# Patient Record
Sex: Female | Born: 1960 | Race: White | Hispanic: No | Marital: Married | Smoking: Former smoker
Health system: Southern US, Community
[De-identification: ages and names within clinical notes are randomized; demographics above are authoritative.]

## PROBLEM LIST (undated history)

## (undated) DIAGNOSIS — Z973 Presence of spectacles and contact lenses: Secondary | ICD-10-CM

## (undated) DIAGNOSIS — F341 Dysthymic disorder: Secondary | ICD-10-CM

## (undated) DIAGNOSIS — R000 Tachycardia, unspecified: Secondary | ICD-10-CM

## (undated) DIAGNOSIS — R002 Palpitations: Secondary | ICD-10-CM

## (undated) DIAGNOSIS — E785 Hyperlipidemia, unspecified: Secondary | ICD-10-CM

## (undated) DIAGNOSIS — K219 Gastro-esophageal reflux disease without esophagitis: Secondary | ICD-10-CM

## (undated) DIAGNOSIS — R0602 Shortness of breath: Secondary | ICD-10-CM

## (undated) DIAGNOSIS — I10 Essential (primary) hypertension: Secondary | ICD-10-CM

## (undated) DIAGNOSIS — F17200 Nicotine dependence, unspecified, uncomplicated: Secondary | ICD-10-CM

## (undated) HISTORY — DX: Hyperlipidemia, unspecified: E78.5

## (undated) HISTORY — DX: Tachycardia, unspecified: R00.0

## (undated) HISTORY — DX: Shortness of breath: R06.02

## (undated) HISTORY — PX: BREAST EXCISIONAL BIOPSY: SUR124

## (undated) HISTORY — PX: COLONOSCOPY: SHX174

## (undated) HISTORY — DX: Essential (primary) hypertension: I10

## (undated) HISTORY — PX: BREAST BIOPSY: SHX20

## (undated) HISTORY — DX: Dysthymic disorder: F34.1

## (undated) HISTORY — DX: Palpitations: R00.2

## (undated) HISTORY — DX: Nicotine dependence, unspecified, uncomplicated: F17.200

---

## 1999-12-23 ENCOUNTER — Encounter: Admission: RE | Admit: 1999-12-23 | Discharge: 1999-12-23 | Payer: Self-pay | Admitting: Obstetrics and Gynecology

## 1999-12-23 ENCOUNTER — Encounter: Payer: Self-pay | Admitting: Obstetrics and Gynecology

## 2001-01-25 ENCOUNTER — Other Ambulatory Visit: Admission: RE | Admit: 2001-01-25 | Discharge: 2001-01-25 | Payer: Self-pay | Admitting: Obstetrics and Gynecology

## 2003-01-16 ENCOUNTER — Other Ambulatory Visit: Admission: RE | Admit: 2003-01-16 | Discharge: 2003-01-16 | Payer: Self-pay | Admitting: Obstetrics and Gynecology

## 2005-06-19 ENCOUNTER — Ambulatory Visit: Payer: Self-pay | Admitting: Family Medicine

## 2005-07-25 ENCOUNTER — Ambulatory Visit: Payer: Self-pay | Admitting: Family Medicine

## 2005-07-27 ENCOUNTER — Ambulatory Visit: Payer: Self-pay | Admitting: Family Medicine

## 2007-03-28 HISTORY — PX: BREAST MASS EXCISION: SHX1267

## 2007-05-09 ENCOUNTER — Encounter: Admission: RE | Admit: 2007-05-09 | Discharge: 2007-05-09 | Payer: Self-pay | Admitting: Obstetrics and Gynecology

## 2007-05-17 ENCOUNTER — Encounter: Admission: RE | Admit: 2007-05-17 | Discharge: 2007-05-17 | Payer: Self-pay | Admitting: Obstetrics and Gynecology

## 2007-05-22 ENCOUNTER — Encounter (INDEPENDENT_AMBULATORY_CARE_PROVIDER_SITE_OTHER): Payer: Self-pay | Admitting: Diagnostic Radiology

## 2007-05-22 ENCOUNTER — Encounter: Admission: RE | Admit: 2007-05-22 | Discharge: 2007-05-22 | Payer: Self-pay | Admitting: Obstetrics and Gynecology

## 2007-06-24 ENCOUNTER — Encounter: Admission: RE | Admit: 2007-06-24 | Discharge: 2007-06-24 | Payer: Self-pay | Admitting: Surgery

## 2007-06-27 ENCOUNTER — Ambulatory Visit (HOSPITAL_BASED_OUTPATIENT_CLINIC_OR_DEPARTMENT_OTHER): Admission: RE | Admit: 2007-06-27 | Discharge: 2007-06-27 | Payer: Self-pay | Admitting: Surgery

## 2007-06-27 ENCOUNTER — Encounter (INDEPENDENT_AMBULATORY_CARE_PROVIDER_SITE_OTHER): Payer: Self-pay | Admitting: Surgery

## 2007-06-27 ENCOUNTER — Encounter: Admission: RE | Admit: 2007-06-27 | Discharge: 2007-06-27 | Payer: Self-pay | Admitting: Surgery

## 2007-07-22 ENCOUNTER — Ambulatory Visit: Payer: Self-pay | Admitting: Oncology

## 2007-11-18 ENCOUNTER — Ambulatory Visit: Payer: Self-pay | Admitting: Cardiovascular Disease

## 2007-12-05 ENCOUNTER — Ambulatory Visit: Payer: Self-pay

## 2007-12-05 ENCOUNTER — Encounter: Payer: Self-pay | Admitting: Cardiovascular Disease

## 2007-12-11 ENCOUNTER — Ambulatory Visit: Payer: Self-pay | Admitting: Cardiovascular Disease

## 2008-03-27 HISTORY — PX: ENDOMETRIAL ABLATION: SHX621

## 2008-04-07 ENCOUNTER — Ambulatory Visit: Payer: Self-pay | Admitting: Cardiovascular Disease

## 2008-05-11 ENCOUNTER — Encounter: Admission: RE | Admit: 2008-05-11 | Discharge: 2008-05-11 | Payer: Self-pay | Admitting: Obstetrics and Gynecology

## 2008-07-03 DIAGNOSIS — R002 Palpitations: Secondary | ICD-10-CM | POA: Insufficient documentation

## 2008-07-03 DIAGNOSIS — R000 Tachycardia, unspecified: Secondary | ICD-10-CM | POA: Insufficient documentation

## 2008-07-03 DIAGNOSIS — F341 Dysthymic disorder: Secondary | ICD-10-CM | POA: Insufficient documentation

## 2008-07-03 DIAGNOSIS — R0602 Shortness of breath: Secondary | ICD-10-CM | POA: Insufficient documentation

## 2008-07-07 ENCOUNTER — Encounter: Payer: Self-pay | Admitting: Cardiovascular Disease

## 2008-07-07 ENCOUNTER — Ambulatory Visit: Payer: Self-pay | Admitting: Cardiovascular Disease

## 2008-07-07 DIAGNOSIS — Z87891 Personal history of nicotine dependence: Secondary | ICD-10-CM | POA: Insufficient documentation

## 2009-03-22 IMAGING — MG MM BREAST NEEDLE LOCALIZATION*L*
3 series · 3 of 3 positions shown · non-contrast
Comparison: none

MAMMO BREAST SURGICAL SPECIMEN

BREAST WIRE LOCALIZATION *L*
LEFT BREAST WIRE LOCALIZATION AND SURGICAL SPECIMEN RADIOGRAPH:
CLINICAL DATA: Left breast calcifications/sclerosing papilloma, preoperative needle localization 
has been requested.

[L LM]
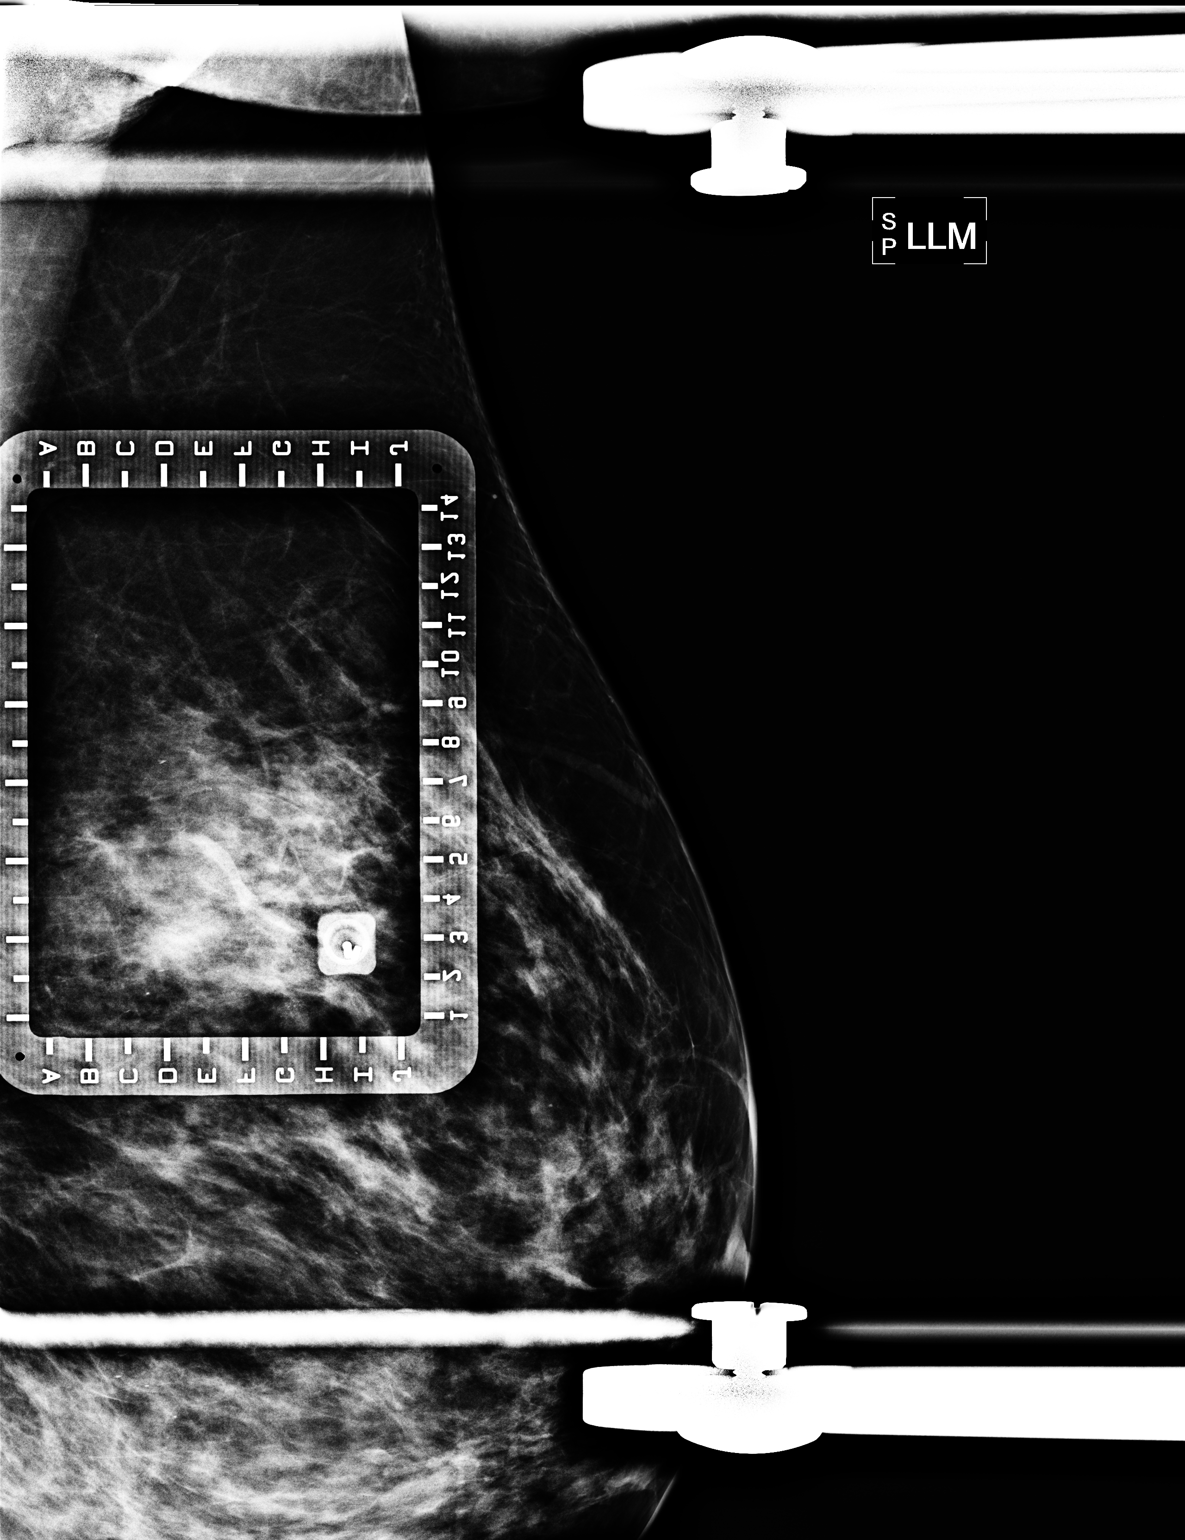

[L CC (1 of 2)]
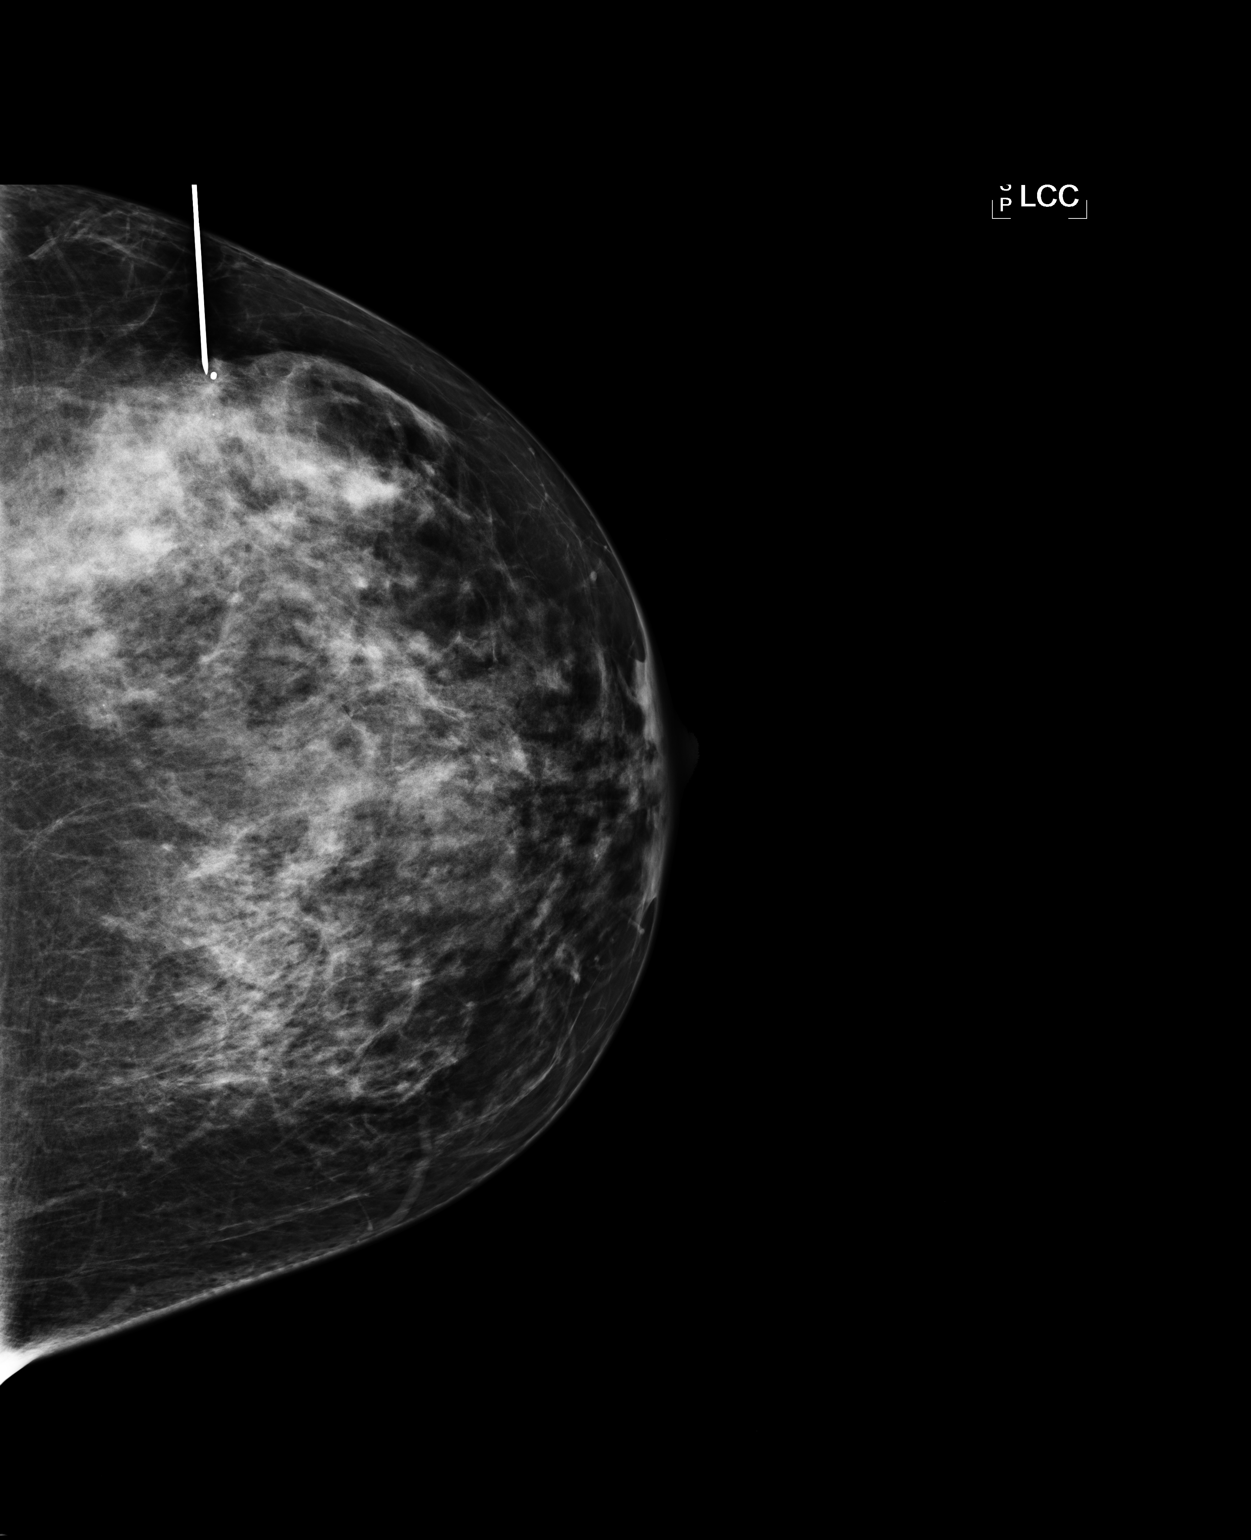

[L CC (2 of 2)]
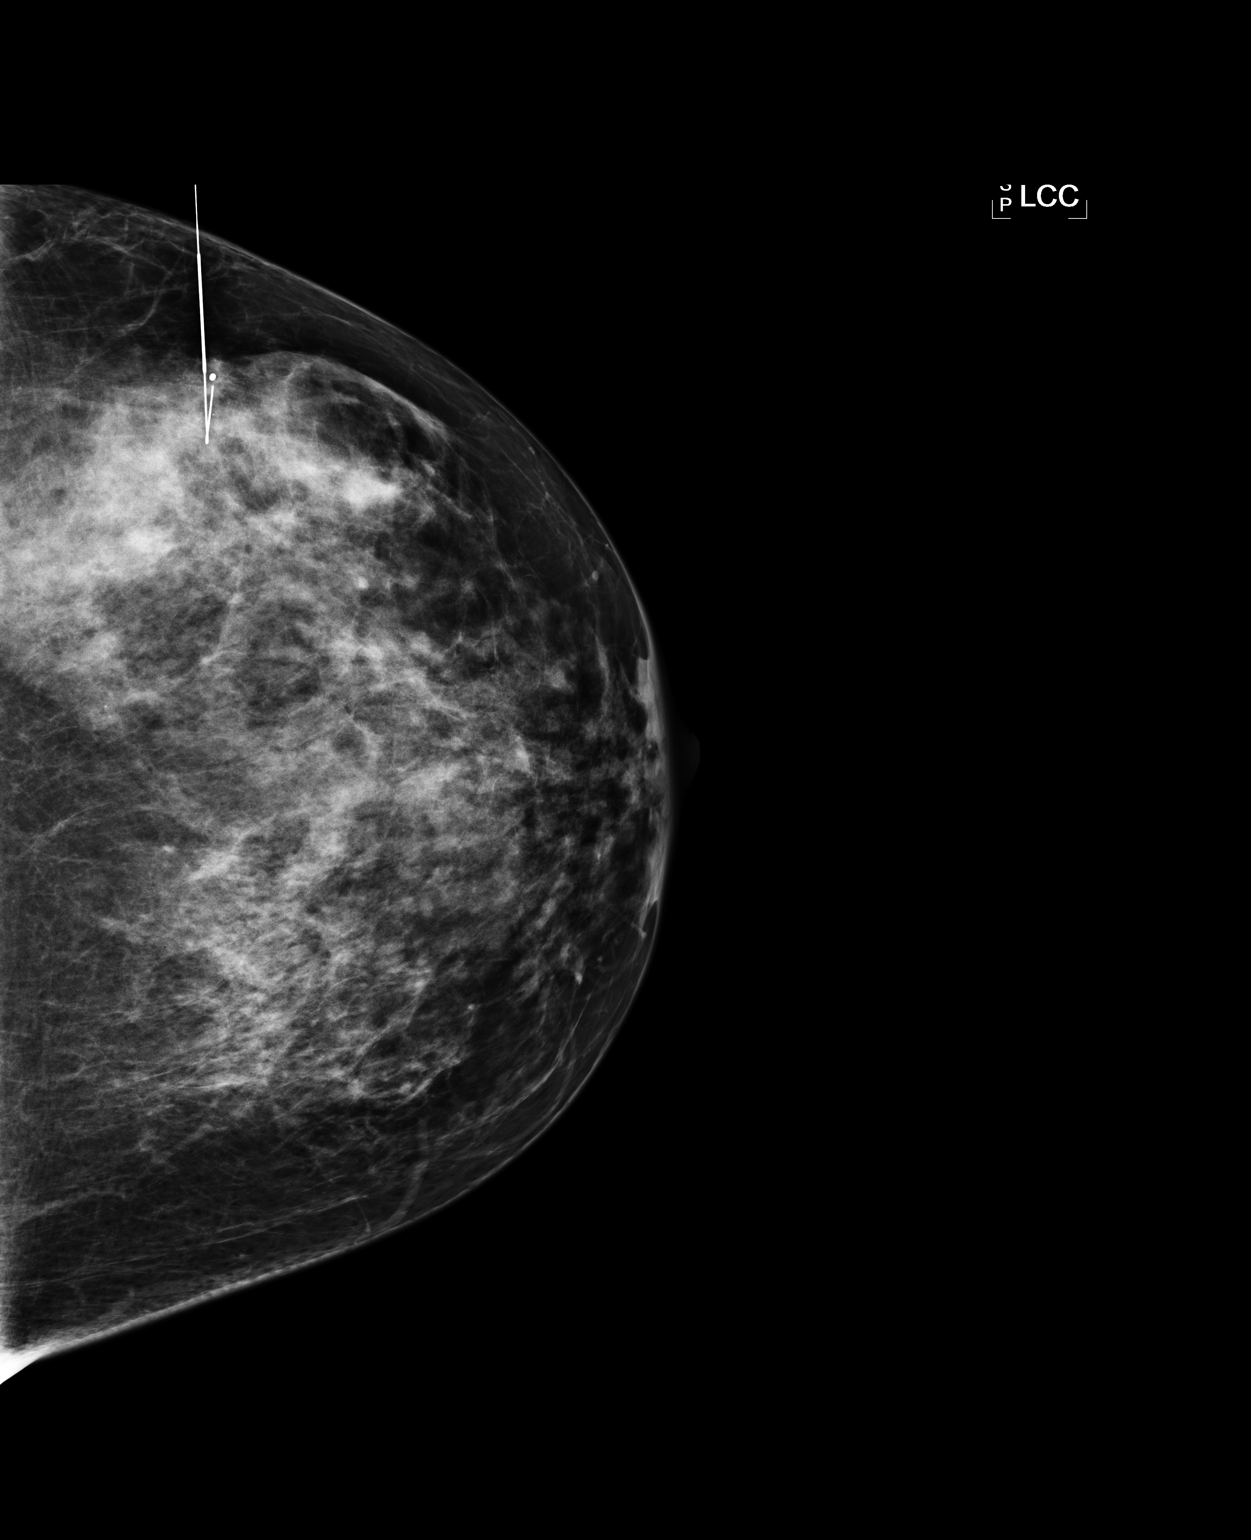

[3 of 3 positions shown; findings below may reference images not displayed]

Informed written consent was obtained. Using mammographic guidance, standard sterile technique, 2% 
Lidocaine for local anesthesia, and a lateral to medial approach, the clip and residual 
calcifications in the left upper outer quadrant were localized and a 15 cm Kopans wire positioned 
adjacent to the area.  The patient tolerated the procedure without immediate complication. A 
diagram and films annotating the position of the wire were sent to the operating room with the 
patient.

Specimen radiography demonstrates residual microcalcifications, the intact hook wire and clip.  The
calcifications were marked with a pin for pathology.  The specimen radiograph was performed in the
operating room.
IMPRESSION: Left mammographically guided needle localization and specimen radiography performed in the OR as 
above.

,

## 2009-05-12 ENCOUNTER — Encounter: Admission: RE | Admit: 2009-05-12 | Discharge: 2009-05-12 | Payer: Self-pay | Admitting: Obstetrics and Gynecology

## 2009-05-27 ENCOUNTER — Encounter (INDEPENDENT_AMBULATORY_CARE_PROVIDER_SITE_OTHER): Payer: Self-pay | Admitting: *Deleted

## 2009-08-25 ENCOUNTER — Ambulatory Visit (HOSPITAL_COMMUNITY): Admission: RE | Admit: 2009-08-25 | Discharge: 2009-08-25 | Payer: Self-pay | Admitting: Obstetrics and Gynecology

## 2009-10-22 ENCOUNTER — Ambulatory Visit: Payer: Self-pay | Admitting: Cardiovascular Disease

## 2010-04-28 NOTE — Letter (Signed)
Summary: Appointment - Reminder 2  [facility] [employer], Main Office  [address]   [place], [place] [address]   Phone: [phone]  Fax: [phone]     May 27, 2009 MRN: [id number]   Amanda Kelly [address] [place], [place]  [address]   Dear Ms. Santilli,  Our records indicate that it is time to schedule a follow-up appointment with Dr. Eden Emms. It is very important that we reach you to schedule this appointment. We look forward to participating in your health care needs. Please contact us at the number listed above at your earliest convenience to schedule your appointment.  If you are unable to make an appointment at this time, give [place] a call so we can update our records.     Sincerely,   Migdalia Dk [facility] Scheduling Team

## 2010-04-28 NOTE — Assessment & Plan Note (Signed)
Summary: 1 year follow up/mt   Visit Type:  Follow-up Primary Provider:  Dr Paulene Floor  CC:  no complaints.  History of Present Illness: Amanda Kelly is seen today in followup for tachycardia.  Her stress echo was normal.  She seems to be improved on 50 mg of Toprol.  We don't have a physiologic reason for any tachycardia.  She is on Cymbalta for over a year. .  Her stress level seemed to be improved today.  I told her since she is symptomatically improved on beta blockers we would continue these.  There is currently no indication for any further cardiac workup.  Current Problems (verified): 1)  Smoker  (ICD-305.1) 2)  Shortness of Breath  (ICD-786.05) 3)  Anxiety Depression  (ICD-300.4) 4)  Tachycardia  (ICD-785.0) 5)  Palpitations  (ICD-785.1)  Current Medications (verified): 1)  Toprol Xl 50 Mg Xr24h-Tab (Metoprolol Succinate) .Marland Kitchen.. 1 Tablet By Mouth Once A Day 2)  Cymbalta 60 Mg Cpep (Duloxetine Hcl) .Marland Kitchen.. 1 Tab By Mouth Once Daily 3)  Multivitamins   Tabs (Multiple Vitamin) .Marland Kitchen.. 1 Tab By Mouth Once Daily  Allergies (verified): No Known Drug Allergies  Past History:  Past Medical History: Last updated: 07/07/2008 Palpitations Tachycardia Anxiety/Depression Lumpectomy Smoker  Past Surgical History: Last updated: 07/03/2008 lumpectomy in April of this year for a benign tumor. Left breast needle localized excisional biopsy.   Family History: Last updated: 07/07/2008 Mother and father alive without premature CAD  Social History: Last updated: 07/07/2008 The patient is married.  Her husband works in Engineer, water.  She has 4 adopted special needs children.  She is a Futures trader.  She smokes a pack a day, does not drink.   Review of Systems       Denies fever, malais, weight loss, blurry vision, decreased visual acuity, cough, sputum, SOB, hemoptysis, pleuritic pain, palpitaitons, heartburn, abdominal pain, melena, lower extremity edema, claudication, or rash.   Vital  Signs:  Patient profile:   50 year old female Height:      65 inches Weight:      246 pounds BMI:     41.08 Pulse rate:   80 / minute BP sitting:   129 / 83  (left arm) Cuff size:   regular  Vitals Entered By: Burnett Kanaris, CNA (October 22, 2009 8:10 AM)  Physical Exam  General:  Affect appropriate Healthy:  appears stated age HEENT: normal Neck supple with no adenopathy JVP normal no bruits no thyromegaly Lungs clear with no wheezing and good diaphragmatic motion Heart:  S1/S2 no murmur,rub, gallop or click PMI normal Abdomen: benighn, BS positve, no tenderness, no AAA no bruit.  No HSM or HJR Distal pulses intact with no bruits No edema Neuro non-focal Skin warm and dry    Impression & Recommendations:  Problem # 1:  TACHYCARDIA (ICD-785.0) Improved on BB with no structural heart disease.    Patient Instructions: 1)  Your physician wants you to follow-up in:  1 year with Dr Eden Emms. You will receive a reminder letter in the mail two months in advance. If you don't receive a letter, please call our office to schedule the follow-up appointment.   EKG Report  Procedure date:  10/22/2009  Findings:      NSR 89 Normal ECG

## 2010-06-13 LAB — CBC
MCV: 94.4 fL (ref 78.0–100.0)
RBC: 3.95 MIL/uL (ref 3.87–5.11)
WBC: 8.3 10*3/uL (ref 4.0–10.5)

## 2010-06-13 LAB — PREGNANCY, URINE: Preg Test, Ur: NEGATIVE

## 2010-08-09 NOTE — Assessment & Plan Note (Signed)
[facility]                            CARDIOLOGY OFFICE NOTE   Amanda Kelly, Amanda Kelly                       MRN:          [id number]  DATE:04/07/2008                            DOB:          1960/11/06    Amanda Kelly returns today for followup.  She continues to be under a bit  of stress.  She has multiple special needs children at home.  She was  referred for palpitations and tachycardia.  She is much improved on  Toprol 25 mg a day.  Her stress echo was normal with appropriate  increase and decrease in her heart rate response.  There is no evidence  of structural heart disease.   The patient's heart rate still tends to run in the high 90s.  I told her  we would probably up titrate her Toprol to 50 mg a day and see if she  felt even better.   I do not think there is any need currently for further testing since her  symptoms of tachycardia and palpitations are improved.  She has not had  any significant chest pain, PND, or orthopnea.  There has been no  presyncope or evidence of rapid onset palpitations.   REVIEW OF SYSTEMS:  Otherwise negative.   MEDICATIONS:  She is on,  1. Cymbalta 60 a day.  2. Multivitamins.  3. Toprol to be increased to 50 a day.   PHYSICAL EXAMINATION:  GENERAL:  Remarkable for an overweight white  female with appropriate affect.  VITAL SIGNS:  Weight is 212, blood pressure 120/80, pulse 92 and  regular, respiratory rate 14, afebrile.  HEENT:  Unremarkable.  NECK:  Carotids are normal without bruit.  No lymphadenopathy,  thyromegaly, or JVP elevation.  LUNGS:  Clear.  Good diaphragmatic motion.  No wheezing.  CARDIAC:  S1, S2 with normal heart sounds.  PMI normal.  ABDOMEN:  Benign.  Bowel sounds positive.  No AAA.  No tenderness.  No  bruit.  No hepatosplenomegaly or hepatojugular reflux.  No tenderness.  EXTREMITIES:  Distal pulses are intact.  No edema.  NEUROLOGIC:  Nonfocal.  SKIN:  Warm and dry.  MUSCULOSKELETAL:  No muscular weakness.   IMPRESSION:  1. Her palpitations improved on beta-blocker.  No evidence of      structural heart disease.  Continue to monitor.  Avoid caffeine and      other stimulants.  2. Relative tachycardia in the setting of normal left ventricular      function and a normal stress echo.  No need for further workup.      Continues to titrate beta-blocker.  3. Anxiety and depression due to stresses from raising multiple      special needs children.  Continue Cymbalta 60 mg a day.  I will see      her back in about 3 months.     Noralyn Pick. Eden Emms, MD, [facility]  Electronically Signed    PCN/MedQ  DD: 04/07/2008  DT: 04/07/2008  Job #: [id number]

## 2010-08-09 NOTE — Assessment & Plan Note (Signed)
[place] [facility]                            CARDIOLOGY OFFICE NOTE   Amanda Kelly, Amanda Kelly                       MRN:          [id number]  DATE:11/18/2007                            DOB:          07-02-1960    A 50 year old patient referred for shortness breath and tachycardia.   The patient has been having relative palpitations over the last few  months.   They were actually brought to her attention during physical exams  initially.  Her heart rate seemed to run on the high side.  Prior to  this, the patient does not really notice them.   She has not had any rapid onset or offset.  She has not had any heart  flip-flops.  There has been no diaphoresis or presyncope.   She has not had previous cardiac problems.  She does not have an anxiety  disorder, however, she is extremely busy.  She has 4 special needs  children at home.  She does smoke about a pack a day over the last 20  years.  She drinks half caffeinated drinks and does not use any other  stimulants or cold medicines on a regular basis.   I reviewed rest of her records from [facility].  In particular, there was a Holter monitor performed.  She had normal  circadian variation in her heart rates in particular at night.  Her  heart rates went down into the 70 or 80 beats per minute range.  There  was no abnormal arrhythmia and only occasional PVCs.   The patient apparently also had lab work at [facility].  I do  not have these records, but the patient tells me she was not anemic and  did not have any thyroid abnormalities.   She specifically has not had any chest pain.  She walks on a regular  basis.  She is not very active due to her time constraints with her 4  adopted children.  There has been no PND, orthopnea, no weight gain, and  no lower extremity edema.   REVIEW OF SYSTEMS:  Otherwise negative.   PAST MEDICAL HISTORY:  Remarkable for lumpectomy in April  of this year  for a benign tumor.  She did not require any adjuvant therapy, otherwise  negative.   The patient is married.  Her husband works in Engineer, water.  She has 4  adopted special needs children.  She is a Futures trader.  She smokes a pack  a day, does not drink.   Family history is remarkable for mother and father being alive at age of  17 and 67.   The patient's only medications include Cymbalta.   She denies any drug allergies.   PHYSICAL EXAMINATION:  GENERAL:  Remarkable for a middle-aged white  female, in no distress.  VITAL SIGNS:  Weight is 213, blood pressure is 120/76, pulse is 90 and  regular, respiratory rate 14, afebrile.  HEENT:  Unremarkable.  Carotids are normal without bruit.  No  lymphadenopathy, no thyromegaly, no JVP elevation.  LUNGS:  Clear.  Good diaphragmatic motion.  No  wheezing.  HEART:  S1 and S2.  Normal heart sounds.  PMI normal.  ABDOMEN:  Benign.  Bowel sounds positive.  No AAA, no tenderness, no  bruit, no hepatosplenomegaly,  no hepatojugular reflux.  No tenderness.  EXTREMITIES:  Distal pulses are intact.  No edema.  NEURO:  Nonfocal.  SKIN:  Warm and dry.  MUSCULOSKELETAL:  No muscular weakness.   EKG shows sinus rhythm.   There are no acute changes.  Chest x-ray from her previous breast cancer  workup showed no cardiomegaly.   IMPRESSION:  1. Relative tachycardia.  I suspect she is just on the high end of the      bell-shaped curve.  There is no indication for beta-blocker      therapy.  The nicotine levels probably do not help.  We will rule      out cardiomyopathy and decreased left ventricular function and      assess heart rate response with a stress echo.  I will see her      after about 4 weeks.  As her heart rate response to stress is      normal and her echo shows normal left ventricular function, I do      not think I would start a beta-blocker.  2. Smoking cessation.  I talked to the patient at length, explained      the  long-term risk factors in terms of lung cancer.  She has      already had breast cancer.  She will talk to her primary MD      regarding possibly starting on Chantix or using patches.  I      explained the concept of setting a quit day and slowly decreasing      her intake.  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  Decreased caloric intake, decreased      carbohydrate intake were discussed with the patient.  4. Breast cancer.  Followup surveillance mammograms per primary MD.   Further workup will be based on the results of her stress echo.     Noralyn Pick. Eden Emms, MD, [facility]  Electronically Signed    PCN/MedQ  DD: 11/18/2007  DT: 11/18/2007  Job #: [id number]   cc:   [facility]

## 2010-08-09 NOTE — Op Note (Signed)
Amanda Kelly, MAXFIELD NO.:  [id number]   MEDICAL RECORD NO.:  [id number]          PATIENT TYPE:  AMB   LOCATION:  DSC                          FACILITY:  [facility]   PHYSICIAN:  Thomas A. 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   PROCEDURE:  Left breast needle localized excisional biopsy.   SURGEON:  Harriette Bouillon, M.D.   ANESTHESIA:  LMA with 0.25% Sensorcaine and local.   ESTIMATED BLOOD LOSS:  20 mL.   SPECIMENS:  Left breast tissue with localizing wire and clip verified by  radiography to be adequate.   INDICATIONS FOR PROCEDURE:  The patient is a 50 year old female who was  found to have a mass on a recent mammogram in her left upper outer  quadrant around 2 o'clock. Core biopsy showed a sclerosing papilloma and  wide excision was recommended for definitive diagnosis given that there  can be error in diagnosing this with core biopsy.  She understood this  and agreed to proceed.  She also had microcalcifications noted.   DESCRIPTION OF PROCEDURE:  The patient was brought to the operating room  after needle localization.  The left breast was then prepped and draped  in a sterile fashion after induction of LMA anesthesia.  The wire exited  the left breast in the left upper outer quadrant around the 2-3 o'clock  position.  After sterile prep and drape local anesthesia was infiltrated  and a curvilinear incision was made around the wire.  The wire was  brought out through the incision and all tissue around the tip of the  wire was excised and sent for radiograph which revealed the clip, wire  and calcifications as well as mass to all be present.  This was sent on  to pathology for  evaluation.   The biopsy cavity was clean, dry and intact without any signs of  bleeding and closed in layers using a deep layer of 3-0 Vicryl and a 4-0  Monocryl subcuticular stitch.  Dermabond was applied.  All final counts  of sponge, needle and instruments were found to be correct for this  portion of the case.  The patient was awoken and taken to recovery in  satisfactory condition.     Thomas A. Cornett, M.D.  Electronically Signed    TAC/MEDQ  D:  06/27/2007  T:  06/27/2007  Job:  [id number]   cc:   Huel Cote, M.D.

## 2010-08-09 NOTE — Assessment & Plan Note (Signed)
[facility]                            CARDIOLOGY OFFICE NOTE   LUSIA, GREIS                       MRN:          [id number]  DATE:12/11/2007                            DOB:          Dec 05, 1960    Ms. Hoopes was initially seen on November 18, 2007, for palpitations and  shortness of breath.   She was sent by [facility], I am not sure who  she saw there.   Her symptoms are somewhat atypical.  She is under a lot of stress at  home.  She has 4 special needs children.  She has been on Cymbalta and  this has helped her mood.  We did a stress echo on her which was normal.  She had an appropriate heart rate response, going from about 95 to 169,  heart rate came down normally in recovery.   Her atrial morphology appears normal.  Her echo was normal.  There was  no evidence of cardiomyopathy or significant valvular heart disease.   The patient continues to feel occasional flip-flop.  She has not had any  rapid runs or persistent runs.  She tells me her blood counts and  thyroid were normal per blood testing and [place].   I told her we could certainly try her on low-dose beta blocker as she  seemed to want to do this.  I told her we would start Toprol 25 a day  and I would see her back in 3 months.  If she feels no difference or  feels worse on the medication, we would stop it.  If she feels improved,  certainly I do not have a problem continuing it.   I did warn her that given her smoking history, it may make her a little  wheezier.   I counseled her for less than 10 minutes regarding her smoking  cessation.  She is not a good candidate for Wellbutrin or Chantix due to  her Cymbalta and I also would not necessarily recommend nicotine  replacement given her relative tachycardia and palpitations.  She will  follow up with [facility] to consider alternative  forms of smoking cessation including  hypnosis.   REVIEW OF SYSTEMS:  Otherwise negative.   PHYSICAL EXAMINATION:  GENERAL:  Remarkable for an overweight white  female in no distress.  VITAL SIGNS:  Blood pressure 120/90, pulse 92 and regular, weight 212,  afebrile.  HEENT:  Unremarkable.  NECK:  Carotids are normal without bruit.  No lymphadenopathy,  thyromegaly, or JVP elevation.  LUNGS:  Clear with good diaphragmatic motion.  No wheezing.  HEART:  S1 and S2.  Normal heart sounds.  PMI normal.  ABDOMEN:  Benign.  Bowel sounds positive.  No AAA.  No tenderness.  No  bruit.  No hepatosplenomegaly.  No hepatojugular reflux.  No tenderness.  EXTREMITIES:  Distal pulses are intact.  No edema.  NEURO:  Nonfocal.  SKIN:  Warm and dry.  No muscular weakness.   IMPRESSION:  1. Palpitations appeared to be benign.  No indication for home  monitoring yet.  Start low-dose beta blocker to see if the      patient's relative tachycardia and palpitations improved.  Normal      left ventricular function without evidence of coronary artery      disease by stress echo.  2. Anxiety, depression, reactive secondary to home situation.      Continue Cymbalta 60 mg a day.  3. Dyspnea relative likely contributing is central obesity and      smoking.  Consider followup outpatient pulmonary function tests per      primary care.  4. Smoking cessation.  Not an ideal candidate for any of the centrally      acting agents or nicotine replacement.  Follow  up with primary      care regarding possible alternative therapies.     Noralyn Pick. Eden Emms, MD, [facility]  Electronically Signed    PCN/MedQ  DD: 12/11/2007  DT: 12/11/2007  Job #: [id number]

## 2010-12-19 LAB — DIFFERENTIAL
Basophils Absolute: 0
Eosinophils Absolute: 0
Eosinophils Relative: 0
Lymphocytes Relative: 19
Lymphs Abs: 1.8
Monocytes Absolute: 0.5
Monocytes Relative: 5

## 2010-12-19 LAB — CBC
HCT: 37.9
Hemoglobin: 13.3
MCV: 91.6
RBC: 4.14
WBC: 9.4

## 2010-12-19 LAB — BASIC METABOLIC PANEL
BUN: 11
Chloride: 103
Creatinine, Ser: 0.53
GFR calc Af Amer: >60
Potassium: 4.4
Sodium: 135

## 2011-06-06 ENCOUNTER — Other Ambulatory Visit: Payer: Self-pay

## 2011-06-06 ENCOUNTER — Telehealth: Payer: Self-pay | Admitting: Cardiovascular Disease

## 2011-06-06 MED ORDER — METOPROLOL SUCCINATE ER 50 MG PO TB24: 50.0000 mg | ORAL_TABLET | Freq: Every day | ORAL | Status: DC

## 2011-06-06 NOTE — Telephone Encounter (Signed)
New msg Pt called and said she needs refill of metoprolol 50mg  [facility] [place] [id number] She said she is out of pills

## 2011-06-06 NOTE — Telephone Encounter (Signed)
SCRIPT SENT TO [facility] WITH 3 MO SUPPLY NO REFILLS PT OVER DUE FOR APPT. APPT MADE FOR 07-11-11  AT 10:30 WITH DR Eden Emms

## 2011-07-10 ENCOUNTER — Encounter: Payer: Self-pay | Admitting: *Deleted

## 2011-07-11 ENCOUNTER — Ambulatory Visit: Payer: Self-pay | Admitting: Cardiovascular Disease

## 2011-07-26 ENCOUNTER — Encounter: Payer: Self-pay | Admitting: *Deleted

## 2011-07-27 ENCOUNTER — Ambulatory Visit (INDEPENDENT_AMBULATORY_CARE_PROVIDER_SITE_OTHER): Payer: Self-pay | Admitting: Cardiovascular Disease

## 2011-07-27 ENCOUNTER — Encounter: Payer: Self-pay | Admitting: Cardiovascular Disease

## 2011-07-27 VITALS — BP 137/89 | HR 98 | Ht 65.0 in | Wt 269.0 lb

## 2011-07-27 DIAGNOSIS — R0602 Shortness of breath: Secondary | ICD-10-CM

## 2011-07-27 DIAGNOSIS — R002 Palpitations: Secondary | ICD-10-CM

## 2011-07-27 DIAGNOSIS — F341 Dysthymic disorder: Secondary | ICD-10-CM

## 2011-07-27 MED ORDER — METOPROLOL SUCCINATE ER 50 MG PO TB24: 50.0000 mg | ORAL_TABLET | Freq: Every day | ORAL | Status: DC

## 2011-07-27 NOTE — Assessment & Plan Note (Signed)
With relative tachycardia  Negative w/u previously stable continue beta blocker

## 2011-07-27 NOTE — Patient Instructions (Signed)
Your physician wants you to follow-up in: YEAR WITH DR NISHAN  You will receive a reminder letter in the mail two months in advance. If you don't receive a letter, please call our office to schedule the follow-up appointment.  Your physician recommends that you continue on your current medications as directed. Please refer to the Current Medication list given to you today. 

## 2011-07-27 NOTE — Assessment & Plan Note (Signed)
Normal exam and ECG  Functional due to obesity observe

## 2011-07-27 NOTE — Progress Notes (Signed)
Patient ID: Amanda Kelly, female   DOB: 1960/08/31, 51 y.o.   MRN: [id number] 51 yo previously seen for tachycardia with normal w/u.  Do to relatively high resting HR likely form obesity and deconditioning she feels better on beta blocker.  No SSCP.  Has some radicular symptoms in left hand.  Had OB/GYN procedure and hormonal issues.  ? Hirtutism  Not watching diet or exercising.  Compliant with meds.  No palpitations dyspnea or presyncope.    ROS: Denies fever, malais, weight loss, blurry vision, decreased visual acuity, cough, sputum, SOB, hemoptysis, pleuritic pain, palpitaitons, heartburn, abdominal pain, melena, lower extremity edema, claudication, or rash.  All other systems reviewed and negative  General: Affect appropriate Healthy:  appears stated age HEENT: normal Neck supple with no adenopathy JVP normal no bruits no thyromegaly Lungs clear with no wheezing and good diaphragmatic motion Heart:  S1/S2 no murmur, no rub, gallop or click PMI normal Abdomen: benighn, BS positve, no tenderness, no AAA no bruit.  No HSM or HJR Distal pulses intact with no bruits No edema Neuro non-focal Skin warm and dry No muscular weakness   Current Outpatient Prescriptions  Medication Sig Dispense Refill  . CYMBALTA 60 MG capsule Take 1 tablet by mouth Daily.      . metoprolol succinate (TOPROL-XL) 50 MG 24 hr tablet Take 1 tablet (50 mg total) by mouth daily. Take with or immediately following a meal.  90 tablet  1    Allergies  Review of patient's allergies indicates no known allergies.  Electrocardiogram:  NSR rate 98  normal  Assessment and Plan

## 2011-07-27 NOTE — Assessment & Plan Note (Signed)
Stable-continue Cymbalta. °

## 2012-01-22 ENCOUNTER — Other Ambulatory Visit: Payer: Self-pay | Admitting: Obstetrics and Gynecology

## 2012-01-22 DIAGNOSIS — Z1231 Encounter for screening mammogram for malignant neoplasm of breast: Secondary | ICD-10-CM

## 2012-03-01 ENCOUNTER — Ambulatory Visit
Admission: RE | Admit: 2012-03-01 | Discharge: 2012-03-01 | Disposition: A | Discharge status: Home / Self Care | Payer: Self-pay | Source: Ambulatory Visit | Attending: Obstetrics and Gynecology | Admitting: Obstetrics and Gynecology

## 2012-03-01 DIAGNOSIS — Z1231 Encounter for screening mammogram for malignant neoplasm of breast: Secondary | ICD-10-CM

## 2012-03-06 ENCOUNTER — Other Ambulatory Visit: Payer: Self-pay | Admitting: Obstetrics and Gynecology

## 2012-03-06 DIAGNOSIS — R928 Other abnormal and inconclusive findings on diagnostic imaging of breast: Secondary | ICD-10-CM

## 2012-03-14 ENCOUNTER — Ambulatory Visit
Admission: RE | Admit: 2012-03-14 | Discharge: 2012-03-14 | Disposition: A | Discharge status: Home / Self Care | Payer: BC Managed Care – PPO | Source: Ambulatory Visit | Attending: Obstetrics and Gynecology | Admitting: Obstetrics and Gynecology

## 2012-03-14 ENCOUNTER — Other Ambulatory Visit: Payer: Self-pay | Admitting: Obstetrics and Gynecology

## 2012-03-14 DIAGNOSIS — R928 Other abnormal and inconclusive findings on diagnostic imaging of breast: Secondary | ICD-10-CM

## 2012-03-26 ENCOUNTER — Other Ambulatory Visit: Payer: Self-pay | Admitting: Obstetrics and Gynecology

## 2012-03-26 ENCOUNTER — Other Ambulatory Visit: Payer: Self-pay | Admitting: Diagnostic Radiology

## 2012-03-26 ENCOUNTER — Ambulatory Visit
Admission: RE | Admit: 2012-03-26 | Discharge: 2012-03-26 | Disposition: A | Discharge status: Home / Self Care | Payer: BC Managed Care – PPO | Source: Ambulatory Visit | Attending: Obstetrics and Gynecology | Admitting: Obstetrics and Gynecology

## 2012-03-26 DIAGNOSIS — R928 Other abnormal and inconclusive findings on diagnostic imaging of breast: Secondary | ICD-10-CM

## 2012-03-27 DIAGNOSIS — F17200 Nicotine dependence, unspecified, uncomplicated: Secondary | ICD-10-CM

## 2012-03-27 HISTORY — DX: Nicotine dependence, unspecified, uncomplicated: F17.200

## 2012-03-29 ENCOUNTER — Ambulatory Visit
Admission: RE | Admit: 2012-03-29 | Discharge: 2012-03-29 | Disposition: A | Discharge status: Home / Self Care | Payer: BC Managed Care – PPO | Source: Ambulatory Visit | Attending: Obstetrics and Gynecology | Admitting: Obstetrics and Gynecology

## 2012-03-29 DIAGNOSIS — R928 Other abnormal and inconclusive findings on diagnostic imaging of breast: Secondary | ICD-10-CM

## 2012-07-04 ENCOUNTER — Other Ambulatory Visit: Payer: Self-pay | Admitting: *Deleted

## 2012-07-04 MED ORDER — DULOXETINE HCL 60 MG PO CPEP: 60.0000 mg | ORAL_CAPSULE | Freq: Every day | ORAL | Status: DC

## 2012-07-26 ENCOUNTER — Encounter: Payer: Self-pay | Admitting: Cardiovascular Disease

## 2012-07-26 ENCOUNTER — Ambulatory Visit (INDEPENDENT_AMBULATORY_CARE_PROVIDER_SITE_OTHER): Payer: BC Managed Care – PPO | Admitting: Cardiovascular Disease

## 2012-07-26 VITALS — BP 128/88 | HR 96 | Ht 65.0 in | Wt 249.0 lb

## 2012-07-26 DIAGNOSIS — R0602 Shortness of breath: Secondary | ICD-10-CM

## 2012-07-26 DIAGNOSIS — R000 Tachycardia, unspecified: Secondary | ICD-10-CM

## 2012-07-26 NOTE — Progress Notes (Signed)
Patient ID: Amanda Kelly, female   DOB: 01/07/1961, 52 y.o.   MRN: [id number] 52 yo previously seen for tachycardia with normal w/u. Do to relatively high resting HR likely form obesity and deconditioning she feels better on beta blocker. No SSCP. Has some radicular symptoms in left hand. Had OB/GYN procedure and hormonal issues. ? Hirtutism Not watching diet or exercising. Compliant with meds. No palpitations dyspnea or presyncope.  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 Quit smoking and I congratulated her on this  ROS: Denies fever, malais, weight loss, blurry vision, decreased visual acuity, cough, sputum, SOB, hemoptysis, pleuritic pain, palpitaitons, heartburn, abdominal pain, melena, lower extremity edema, claudication, or rash.  All other systems reviewed and negative  General: Affect appropriate Overweight white female HEENT: normal Neck supple with no adenopathy JVP normal no bruits no thyromegaly Lungs clear with no wheezing and good diaphragmatic motion Heart:  S1/S2 no murmur, no rub, gallop or click PMI normal Abdomen: benighn, BS positve, no tenderness, no AAA no bruit.  No HSM or HJR Distal pulses intact with no bruits No edema Neuro non-focal Skin warm and dry No muscular weakness   Current Outpatient Prescriptions  Medication Sig Dispense Refill  . atorvastatin (LIPITOR) 40 MG tablet Take 1 tablet by mouth daily.      . DULoxetine (CYMBALTA) 60 MG capsule Take 1 capsule (60 mg total) by mouth daily.  30 capsule  0  . metoprolol succinate (TOPROL-XL) 50 MG 24 hr tablet Take 1 tablet (50 mg total) by mouth daily. Take with or immediately following a meal.  90 tablet  4   No current facility-administered medications for this visit.    Allergies  Review of patient's allergies indicates no known allergies.  Electrocardiogram:  07/27/11  SR rate 98 normal ECG  Assessment and Plan

## 2012-07-26 NOTE — Patient Instructions (Signed)
Your physician wants you to follow-up in: YEAR WITH DR NISHAN  You will receive a reminder letter in the mail two months in advance. If you don't receive a letter, please call our office to schedule the follow-up appointment.  Your physician recommends that you continue on your current medications as directed. Please refer to the Current Medication list given to you today. 

## 2012-07-26 NOTE — Assessment & Plan Note (Signed)
Funtional Normal echo and ETT in past Continue low dose beta blocker

## 2012-07-26 NOTE — Assessment & Plan Note (Signed)
Improved Functional Some allergies this time of year

## 2012-07-31 ENCOUNTER — Other Ambulatory Visit: Payer: Self-pay | Admitting: Nurse Practitioner

## 2012-08-01 NOTE — Telephone Encounter (Signed)
LAST OV 1/14 

## 2012-09-09 ENCOUNTER — Other Ambulatory Visit: Payer: Self-pay | Admitting: Nurse Practitioner

## 2012-09-16 ENCOUNTER — Other Ambulatory Visit: Payer: Self-pay | Admitting: *Deleted

## 2012-09-16 MED ORDER — METOPROLOL SUCCINATE ER 50 MG PO TB24: 50.0000 mg | ORAL_TABLET | Freq: Every day | ORAL | Status: DC

## 2012-09-18 ENCOUNTER — Other Ambulatory Visit: Payer: Self-pay | Admitting: *Deleted

## 2012-09-18 MED ORDER — METOPROLOL SUCCINATE ER 50 MG PO TB24: 50.0000 mg | ORAL_TABLET | Freq: Every day | ORAL | Status: DC

## 2012-09-24 ENCOUNTER — Other Ambulatory Visit: Payer: Self-pay | Admitting: Nurse Practitioner

## 2012-09-24 NOTE — Telephone Encounter (Signed)
Last seen 03/29/12  MMM

## 2012-09-25 ENCOUNTER — Other Ambulatory Visit: Payer: Self-pay | Admitting: Nurse Practitioner

## 2012-10-04 ENCOUNTER — Other Ambulatory Visit: Payer: Self-pay

## 2012-10-04 MED ORDER — ATORVASTATIN CALCIUM 40 MG PO TABS: 40.0000 mg | ORAL_TABLET | Freq: Every day | ORAL | Status: DC

## 2012-10-04 NOTE — Telephone Encounter (Signed)
Last lipids 03/04/12   Last seen 03/29/12  MMM

## 2012-10-07 ENCOUNTER — Other Ambulatory Visit: Payer: Self-pay | Admitting: Nurse Practitioner

## 2012-10-07 ENCOUNTER — Encounter: Payer: Self-pay | Admitting: Nurse Practitioner

## 2012-10-07 MED ORDER — ATORVASTATIN CALCIUM 40 MG PO TABS: 40.0000 mg | ORAL_TABLET | Freq: Every day | ORAL | Status: DC

## 2012-10-31 ENCOUNTER — Other Ambulatory Visit: Payer: Self-pay | Admitting: Nurse Practitioner

## 2012-11-03 ENCOUNTER — Other Ambulatory Visit: Payer: Self-pay | Admitting: Nurse Practitioner

## 2012-11-03 ENCOUNTER — Encounter: Payer: Self-pay | Admitting: Nurse Practitioner

## 2012-11-04 ENCOUNTER — Ambulatory Visit (INDEPENDENT_AMBULATORY_CARE_PROVIDER_SITE_OTHER): Payer: BC Managed Care – PPO | Admitting: Family Medicine

## 2012-11-04 ENCOUNTER — Ambulatory Visit (INDEPENDENT_AMBULATORY_CARE_PROVIDER_SITE_OTHER): Payer: BC Managed Care – PPO

## 2012-11-04 ENCOUNTER — Encounter: Payer: Self-pay | Admitting: Family Medicine

## 2012-11-04 VITALS — BP 150/89 | HR 89 | Temp 98.8°F | Ht 65.0 in | Wt 252.0 lb

## 2012-11-04 DIAGNOSIS — M79609 Pain in unspecified limb: Secondary | ICD-10-CM

## 2012-11-04 DIAGNOSIS — M79671 Pain in right foot: Secondary | ICD-10-CM

## 2012-11-04 MED ORDER — NAPROXEN 500 MG PO TABS: 500.0000 mg | ORAL_TABLET | Freq: Two times a day (BID) | ORAL | Status: DC

## 2012-11-04 MED ORDER — DULOXETINE HCL 60 MG PO CPEP: 60.0000 mg | ORAL_CAPSULE | Freq: Every day | ORAL | Status: DC

## 2012-11-04 NOTE — Patient Instructions (Signed)
Tendinitis  Tendinitis is swelling and inflammation of the tendons. Tendons are band-like tissues that connect muscle to bone. Tendinitis commonly occurs in the:    Shoulders (rotator cuff).   Heels (Achilles tendon).   Elbows (triceps tendon).  CAUSES  Tendinitis is usually caused by overusing the tendon, muscles, and joints involved. When the tissue surrounding a tendon (synovium) becomes inflamed, it is called tenosynovitis. Tendinitis commonly develops in people whose jobs require repetitive motions.  SYMPTOMS   Pain.   Tenderness.   Mild swelling.  DIAGNOSIS  Tendinitis is usually diagnosed by physical exam. Your caregiver may also order X-rays or other imaging tests.  TREATMENT  Your caregiver may recommend certain medicines or exercises for your treatment.  HOME CARE INSTRUCTIONS    Use a sling or splint for as long as directed by your caregiver until the pain decreases.   Put ice on the injured area.   Put ice in a plastic bag.   Place a towel between your skin and the bag.   Leave the ice on for 15-20 minutes, 3-4 times a day.   Avoid using the limb while the tendon is painful. Perform gentle range of motion exercises only as directed by your caregiver. Stop exercises if pain or discomfort increase, unless directed otherwise by your caregiver.   Only take over-the-counter or prescription medicines for pain, discomfort, or fever as directed by your caregiver.  SEEK MEDICAL CARE IF:    Your pain and swelling increase.   You develop new, unexplained symptoms, especially increased numbness in the hands.  MAKE SURE YOU:    Understand these instructions.   Will watch your condition.   Will get help right away if you are not doing well or get worse.  Document Released: 03/10/2000 Document Revised: 06/05/2011 Document Reviewed: 05/30/2010  ExitCare Patient Information 2014 [employer].

## 2012-11-04 NOTE — Progress Notes (Signed)
  Subjective:    Patient ID: Amanda Kelly, female    DOB: [birth year], 52 y.o.   MRN: [id number]  HPI  This 52 y.o. female presents for evaluation of swelling in her feet and ankles.  She has some discomfort In her right foot.  She states it hurts to push on the gast..  Review of Systems C/o right foot pain No chest pain, SOB, HA, dizziness, vision change, N/V, diarrhea, constipation, dysuria, urinary urgency or frequency, myalgias, arthralgias or rash.     Objective:   Physical Exam  Vital signs noted  Well developed well nourished female.  HEENT - Head atraumatic Normocephalic                Eyes - PERRLA, Conjuctiva - clear Sclera- Clear EOMI                Ears - EAC's Wnl TM's Wnl Gross Hearing WNL                Nose - Nares patent                 Throat - oropharanx wnl Respiratory - Lungs CTA bilateral Cardiac - RRR S1 and S2 without murmur MS - TTP anterior right foot.      Assessment & Plan:  Right foot pain - Plan: naproxen (NAPROSYN) 500 MG tablet, DG Foot Complete Right Apply heat to right foot.

## 2012-12-03 ENCOUNTER — Other Ambulatory Visit: Payer: Self-pay | Admitting: Nurse Practitioner

## 2012-12-03 ENCOUNTER — Encounter: Payer: Self-pay | Admitting: Family Medicine

## 2013-01-02 ENCOUNTER — Encounter: Payer: Self-pay | Admitting: Family Medicine

## 2013-01-05 ENCOUNTER — Other Ambulatory Visit: Payer: Self-pay | Admitting: Family Medicine

## 2013-01-30 ENCOUNTER — Other Ambulatory Visit: Payer: Self-pay

## 2013-02-02 ENCOUNTER — Encounter: Payer: Self-pay | Admitting: Family Medicine

## 2013-02-04 ENCOUNTER — Ambulatory Visit (INDEPENDENT_AMBULATORY_CARE_PROVIDER_SITE_OTHER): Payer: BC Managed Care – PPO

## 2013-02-04 DIAGNOSIS — Z23 Encounter for immunization: Secondary | ICD-10-CM

## 2013-02-09 ENCOUNTER — Encounter: Payer: Self-pay | Admitting: Family Medicine

## 2013-02-09 ENCOUNTER — Other Ambulatory Visit: Payer: Self-pay | Admitting: Family Medicine

## 2013-02-10 ENCOUNTER — Other Ambulatory Visit: Payer: Self-pay | Admitting: Family Medicine

## 2013-02-11 ENCOUNTER — Telehealth: Payer: Self-pay | Admitting: Nurse Practitioner

## 2013-02-11 MED ORDER — DULOXETINE HCL 60 MG PO CPEP: 60.0000 mg | ORAL_CAPSULE | Freq: Every day | ORAL | Status: DC

## 2013-03-05 ENCOUNTER — Ambulatory Visit (INDEPENDENT_AMBULATORY_CARE_PROVIDER_SITE_OTHER): Payer: BC Managed Care – PPO

## 2013-03-05 ENCOUNTER — Ambulatory Visit (INDEPENDENT_AMBULATORY_CARE_PROVIDER_SITE_OTHER): Payer: BC Managed Care – PPO | Admitting: Nurse Practitioner

## 2013-03-05 ENCOUNTER — Encounter: Payer: Self-pay | Admitting: Nurse Practitioner

## 2013-03-05 VITALS — BP 138/81 | HR 92 | Temp 96.3°F | Wt 246.0 lb

## 2013-03-05 DIAGNOSIS — I10 Essential (primary) hypertension: Secondary | ICD-10-CM

## 2013-03-05 DIAGNOSIS — F341 Dysthymic disorder: Secondary | ICD-10-CM

## 2013-03-05 DIAGNOSIS — R634 Abnormal weight loss: Secondary | ICD-10-CM

## 2013-03-05 DIAGNOSIS — R1906 Epigastric swelling, mass or lump: Secondary | ICD-10-CM

## 2013-03-05 DIAGNOSIS — E785 Hyperlipidemia, unspecified: Secondary | ICD-10-CM

## 2013-03-05 NOTE — Progress Notes (Signed)
Subjective:    Patient ID: Amanda Kelly, female    DOB: Dec 27, 1960, 52 y.o.   MRN: [id number]  Hypertension This is a chronic problem. The current episode started more than 1 year ago. The problem is unchanged. The problem is controlled. Pertinent negatives include no anxiety, chest pain, headaches, malaise/fatigue, orthopnea, palpitations, peripheral edema or shortness of breath. There are no associated agents to hypertension. Risk factors for coronary artery disease include dyslipidemia, family history, obesity and post-menopausal state. Past treatments include beta blockers. The current treatment provides moderate improvement. There are no compliance problems.   Hyperlipidemia This is a chronic problem. The current episode started more than 1 year ago. The problem is uncontrolled. Recent lipid tests were reviewed and are high. She has no history of diabetes, hypothyroidism, obesity or nephrotic syndrome. Pertinent negatives include no chest pain or shortness of breath. Current antihyperlipidemic treatment includes statins. The current treatment provides moderate improvement of lipids. Compliance problems include adherence to diet and adherence to exercise.  Risk factors for coronary artery disease include dyslipidemia, family history, hypertension, obesity and post-menopausal.  Depression Cymbalta-works great- can really tell a difference if she misses a dose.  * Patient c/o loosing 25 lbs over the last year without trying despite the fact that she stopped smoking-  Full feeling in abdomen- denies abdominal pain- but says she can eat small amounts of food at a time. Review of Systems  Constitutional: Negative for malaise/fatigue.  Respiratory: Negative for shortness of breath.   Cardiovascular: Negative for chest pain, palpitations and orthopnea.  Neurological: Negative for headaches.       Objective:   Physical Exam  Constitutional: She is oriented to person, place, and time. She  appears well-developed and well-nourished.  HENT:  Nose: Nose normal.  Mouth/Throat: Oropharynx is clear and moist.  Eyes: EOM are normal.  Neck: Trachea normal, normal range of motion and full passive range of motion without pain. Neck supple. No JVD present. Carotid bruit is not present. No thyromegaly present.  Cardiovascular: Normal rate, regular rhythm, normal heart sounds and intact distal pulses.  Exam reveals no gallop and no friction rub.   No murmur heard. Pulmonary/Chest: Effort normal and breath sounds normal.  Abdominal: Soft. Bowel sounds are normal. She exhibits no distension and no mass. There is tenderness (slight epigastric pain).  Musculoskeletal: Normal range of motion.  Lymphadenopathy:    She has no cervical adenopathy.  Neurological: She is alert and oriented to person, place, and time. She has normal reflexes.  Skin: Skin is warm and dry.  Psychiatric: She has a normal mood and affect. Her behavior is normal. Judgment and thought content normal.   BP 138/81  Pulse 92  Temp(Src) 96.3 F (35.7 C) (Oral)  Wt 246 lb (111.585 kg)  KUB- no acute findings-Preliminary reading by Paulene Floor, FNP  [facility]        Assessment & Plan:   1. Hypertension   2. Hyperlipidemia LDL goal < 100   3. ANXIETY DEPRESSION   4. Epigastric fullness   5. Loss of weight    Orders Placed This Encounter  Procedures  . DG Abd 1 View    Standing Status: Future     Number of Occurrences: 1     Standing Expiration Date: 05/05/2014    Order Specific Question:  Reason for Exam (SYMPTOM  OR DIAGNOSIS REQUIRED)    Answer:  epigastric fullness    Order Specific Question:  Is the patient pregnant?  Answer:  No    Order Specific Question:  Preferred imaging location?    Answer:  Internal  . CMP14+EGFR  . NMR, lipoprofile  . Lipase  . Amylase  . Ambulatory referral to Gastroenterology    Referral Priority:  Routine    Referral Type:  Consultation    Referral Reason:  Specialty  Services Required    Requested Specialty:  Gastroenterology    Number of Visits Requested:  1   Continue all meds Labs pending Diet and exercise encouraged Health maintenance reviewed Follow up in 3 months and prn  Amanda Daphine Deutscher, FNP

## 2013-03-06 ENCOUNTER — Encounter: Payer: Self-pay | Admitting: Internal Medicine

## 2013-03-07 LAB — CMP14+EGFR
ALT: 23 IU/L (ref 0–32)
Albumin/Globulin Ratio: 2 (ref 1.1–2.5)
Albumin: 4.7 g/dL (ref 3.5–5.5)
Alkaline Phosphatase: 96 IU/L (ref 39–117)
BUN/Creatinine Ratio: 20 (ref 9–23)
BUN: 13 mg/dL (ref 6–24)
CO2: 25 mmol/L (ref 18–29)
Calcium: 9.8 mg/dL (ref 8.7–10.2)
Creatinine, Ser: 0.65 mg/dL (ref 0.57–1.00)
GFR calc Af Amer: 120 mL/min/{1.73_m2} (ref >59)
GFR calc non Af Amer: 104 mL/min/{1.73_m2} (ref >59)
Globulin, Total: 2.3 g/dL (ref 1.5–4.5)
Sodium: 138 mmol/L (ref 134–144)
Total Bilirubin: 0.3 mg/dL (ref 0.0–1.2)
Total Protein: 7 g/dL (ref 6.0–8.5)

## 2013-03-07 LAB — NMR, LIPOPROFILE
LDL Particle Number: 1715 nmol/L — ABNORMAL HIGH (ref <1000)
LDL Size: 21.3 nm (ref >20.5)
LP-IR Score: 55 — ABNORMAL HIGH (ref <=45)
Triglycerides by NMR: 173 mg/dL — ABNORMAL HIGH (ref <150)

## 2013-03-07 LAB — AMYLASE: Amylase: 60 U/L (ref 31–124)

## 2013-03-07 LAB — LIPASE: Lipase: 17 U/L (ref 0–59)

## 2013-04-10 ENCOUNTER — Other Ambulatory Visit: Payer: Self-pay | Admitting: Nurse Practitioner

## 2013-04-11 ENCOUNTER — Encounter: Payer: Self-pay | Admitting: Internal Medicine

## 2013-04-11 ENCOUNTER — Ambulatory Visit (INDEPENDENT_AMBULATORY_CARE_PROVIDER_SITE_OTHER): Payer: BC Managed Care – PPO | Admitting: Internal Medicine

## 2013-04-11 VITALS — BP 156/96 | HR 96 | Ht 65.0 in | Wt 252.5 lb

## 2013-04-11 DIAGNOSIS — R634 Abnormal weight loss: Secondary | ICD-10-CM

## 2013-04-11 DIAGNOSIS — K219 Gastro-esophageal reflux disease without esophagitis: Secondary | ICD-10-CM

## 2013-04-11 DIAGNOSIS — R6881 Early satiety: Secondary | ICD-10-CM

## 2013-04-11 DIAGNOSIS — Z1211 Encounter for screening for malignant neoplasm of colon: Secondary | ICD-10-CM

## 2013-04-11 MED ORDER — MOVIPREP 100 G PO SOLR: 1.0000 | Freq: Once | ORAL | Status: DC

## 2013-04-11 NOTE — Progress Notes (Signed)
HISTORY OF PRESENT ILLNESS:  Amanda Kelly is a 53 y.o. female with hypertension, hyperlipidemia, obesity, anxiety/depression, and GERD. She is referred today for consultation regarding early satiety and unexplained weight loss. The patient reports 25-30 pound weight loss over the past year. This was coincident with discontinuation of smoking. She reports early satiety or fullness sensation. Clearly eating less over the past year. No abdominal pain. She denies nausea or vomiting. No melena, hematochezia, or change in bowel habits. No prior history of GI evaluations. She does have chronic GERD for which she uses PPI regularly. Off medication she has significant classic symptoms. On medication, good control of symptoms. She denies dysphagia. Review of outside blood work from her 109 office 03/05/2013 reveals normal comprehensive metabolic panel. Elevated cholesterol at 232. Outside x-ray from the same day reveals normal 1 view of the abdomen.  REVIEW OF SYSTEMS:  All non-GI ROS negative except for anxiety, depression, fatigue, irregular heart rate, night sweats, sleeping problems, urinary  Past Medical History  Diagnosis Date  . ANXIETY DEPRESSION   . SMOKER     former  . TACHYCARDIA   . Palpitations   . Shortness of breath   . Hypertension   . Hyperlipidemia     Past Surgical History  Procedure Laterality Date  . Breast mass excision Left     Social History Amanda Kelly  reports that she quit smoking about a year ago. Her smoking use included Cigarettes. She smoked 0.00 packs per day. She has never used smokeless tobacco. She reports that she drinks alcohol. She reports that she does not use illicit drugs.  family history includes Breast cancer in her maternal grandmother.  No Known Allergies     PHYSICAL EXAMINATION: Vital signs: BP 156/96  Pulse 96  Ht 5\' 5"  (1.651 m)  Wt 252 lb 8 oz (114.533 kg)  BMI 42.02 kg/m2  Constitutional: Obese, generally well-appearing, no  acute distress Psychiatric: alert and oriented x3, cooperative Eyes: extraocular movements intact, anicteric, conjunctiva pink Mouth: oral pharynx moist, no lesions Neck: supple no lymphadenopathy Cardiovascular: heart regular rate and rhythm, no murmur Lungs: clear to auscultation bilaterally Abdomen: soft, obese, nontender, nondistended, no obvious ascites, no peritoneal signs, normal bowel sounds, no organomegaly Rectal: Deferred until colonoscopy Extremities: no lower extremity edema bilaterally Skin: no lesions on visible extremities Neuro: No focal deficits. No asterixis.    ASSESSMENT:  #1. Unexplained weight loss #2. Early satiety #3. Chronic GERD. On PPI #4. Colon cancer screening. Baseline risk   PLAN:  #1. Reflux precautions #2. Continue PPI to control GERD symptoms #3. Diagnostic upper endoscopy to screen for Barrett's as well as evaluate problems with early satiety and weight loss.The nature of the procedure, as well as the risks, benefits, and alternatives were carefully and thoroughly reviewed with the patient. Ample time for discussion and questions allowed. The patient understood, was satisfied, and agreed to proceed. #4. Screening colonoscopy.The nature of the procedure, as well as the risks, benefits, and alternatives were carefully and thoroughly reviewed with the patient. Ample time for discussion and questions allowed. The patient understood, was satisfied, and agreed to proceed. Movi prep prescribed. Patient instructed on his use #5. If endoscopic evaluations do not explain weight loss, then consider advanced imaging with CT scan of the chest, abdomen, and pelvis

## 2013-04-11 NOTE — Patient Instructions (Signed)

## 2013-04-15 ENCOUNTER — Encounter: Payer: Self-pay | Admitting: Internal Medicine

## 2013-04-30 ENCOUNTER — Other Ambulatory Visit: Payer: Self-pay

## 2013-04-30 DIAGNOSIS — Z1231 Encounter for screening mammogram for malignant neoplasm of breast: Secondary | ICD-10-CM

## 2013-05-06 ENCOUNTER — Encounter: Payer: Self-pay | Admitting: Nurse Practitioner

## 2013-05-20 ENCOUNTER — Inpatient Hospital Stay: Admission: RE | Admit: 2013-05-20 | Payer: BC Managed Care – PPO | Source: Ambulatory Visit

## 2013-06-02 ENCOUNTER — Encounter: Payer: Self-pay | Admitting: Nurse Practitioner

## 2013-06-03 ENCOUNTER — Ambulatory Visit (AMBULATORY_SURGERY_CENTER): Payer: BC Managed Care – PPO | Admitting: Internal Medicine

## 2013-06-03 ENCOUNTER — Other Ambulatory Visit: Payer: Self-pay

## 2013-06-03 ENCOUNTER — Encounter: Payer: Self-pay | Admitting: Internal Medicine

## 2013-06-03 ENCOUNTER — Telehealth: Payer: Self-pay

## 2013-06-03 VITALS — BP 137/99 | HR 86 | Temp 96.7°F | Resp 17 | Ht 65.0 in | Wt 252.0 lb

## 2013-06-03 DIAGNOSIS — R634 Abnormal weight loss: Secondary | ICD-10-CM

## 2013-06-03 DIAGNOSIS — Z1211 Encounter for screening for malignant neoplasm of colon: Secondary | ICD-10-CM

## 2013-06-03 DIAGNOSIS — R6881 Early satiety: Secondary | ICD-10-CM

## 2013-06-03 DIAGNOSIS — K219 Gastro-esophageal reflux disease without esophagitis: Secondary | ICD-10-CM

## 2013-06-03 MED ORDER — SODIUM CHLORIDE 0.9 % IV SOLN: 500.0000 mL | INTRAVENOUS | Status: DC

## 2013-06-03 NOTE — Telephone Encounter (Signed)
Pt scheduled for CT of CAP at [facility] CT 06/06/13@9am . Pt to be NPO after midnight and drink bottle 1 of contrast at 7am, second bottle at [place] RN to notify pt of appt and instructions.

## 2013-06-03 NOTE — Progress Notes (Signed)
Report to pacu rn, vss, bbs=clear, chin lift given during procedure and ventilating well.

## 2013-06-03 NOTE — Op Note (Signed)
[place]  [address]. [place], [address]   ENDOSCOPY PROCEDURE REPORT  PATIENT: Amanda Kelly, Amanda Kelly  MR#: [id number] BIRTHDATE: May 10, 1960 , 52  yrs. old GENDER: Female ENDOSCOPIST: Eustace Quail, MD REFERRED BY:  Breck Coons, N.P. PROCEDURE DATE:  06/03/2013 PROCEDURE:  EGD, diagnostic ASA CLASS:     Class II INDICATIONS:  Weight loss.  Early satiety. GERD MEDICATIONS: MAC sedation, administered by CRNA and propofol (Diprivan) 100mg  IV TOPICAL ANESTHETIC: none  DESCRIPTION OF PROCEDURE: After the risks benefits and alternatives of the procedure were thoroughly explained, informed consent was obtained.  The LB [id number] endoscope was introduced through the mouth and advanced to the second portion of the duodenum. Without limitations.  The instrument was slowly withdrawn as the mucosa was fully examined.    EXAM:The upper, middle and distal third of the esophagus were carefully inspected and no abnormalities were noted.  The z-line was well seen at the GEJ.  The endoscope was pushed into the fundus which was normal including a retroflexed view.  The antrum, gastric body, first and second part of the duodenum were unremarkable. Retroflexed views revealed no abnormalities.     The scope was then withdrawn from the patient and the procedure completed.  COMPLICATIONS: There were no complications. ENDOSCOPIC IMPRESSION: 1. Normal EGD 2. GERD  RECOMMENDATIONS: 1.  Anti-reflux regimen to be followed 2.  My office will schedule contrast-enhanced CT scan of the chest, abdomen, and pelvis "unexplained weight loss"  REPEAT EXAM:  eSigned:  Eustace Quail, MD 06/03/2013 10:29 AM   WE:XHBZ Harmon Pier, NP and The Patient

## 2013-06-03 NOTE — Op Note (Signed)
[place]  [address]. [place], [address]   COLONOSCOPY PROCEDURE REPORT  PATIENT: Amanda, Kelly  MR#: [id number] BIRTHDATE: 08-20-1960 , 52  yrs. old GENDER: Female ENDOSCOPIST: Eustace Quail, MD REFERRED FY:BOFB Rockne Coons, N.P. PROCEDURE DATE:  06/03/2013 PROCEDURE:   Colonoscopy, screening First Screening Colonoscopy - Avg.  risk and is 50 yrs.  old or older - No.  Prior Negative Screening - Now for repeat screening. N/A  History of Adenoma - Now for follow-up colonoscopy & has been > or = to 3 yrs.  N/A  Polyps Removed Today? No.  Recommend repeat exam, <10 yrs? No. ASA CLASS:   Class II INDICATIONS:average risk screening. MEDICATIONS: MAC sedation, administered by CRNA and propofol (Diprivan) 320mg  IV  DESCRIPTION OF PROCEDURE:   After the risks benefits and alternatives of the procedure were thoroughly explained, informed consent was obtained.  A digital rectal exam revealed no abnormalities of the rectum.   The LB [id number]  endoscope was introduced through the anus and advanced to the cecum, which was identified by both the appendix and ileocecal valve. No adverse events experienced.   The quality of the prep was excellent, using MoviPrep  The instrument was then slowly withdrawn as the colon was fully examined.  COLON FINDINGS: A normal appearing cecum, ileocecal valve, and appendiceal orifice were identified.  The ascending, hepatic flexure, transverse, splenic flexure, descending, sigmoid colon and rectum appeared unremarkable.  No polyps or cancers were seen. Retroflexed views revealed no abnormalities. The time to cecum=2 minutes 12 seconds.  Withdrawal time=9 minutes 21 seconds.  The scope was withdrawn and the procedure completed. COMPLICATIONS: There were no complications.  ENDOSCOPIC IMPRESSION: 1.Normal colon  RECOMMENDATIONS: 1.  Continue current colorectal screening recommendations for "routine risk" patients with a  repeat colonoscopy in 10 years. 2.  Upper endoscopy today (see reports and recommendations)   eSigned:  Eustace Quail, MD 06/03/2013 10:26 AM   cc: Chevis Pretty, NP and The Patient

## 2013-06-03 NOTE — Patient Instructions (Addendum)
YOU HAD AN ENDOSCOPIC PROCEDURE TODAY AT THE [facility]: Refer to the procedure report that was given to you for any specific questions about what was found during the examination.  If the procedure report does not answer your questions, please call your gastroenterologist to clarify.  If you requested that your care partner not be given the details of your procedure findings, then the procedure report has been included in a sealed envelope for you to review at your convenience later.  YOU SHOULD EXPECT: Some feelings of bloating in the abdomen. Passage of more gas than usual.  Walking can help get rid of the air that was put into your GI tract during the procedure and reduce the bloating. If you had a lower endoscopy (such as a colonoscopy or flexible sigmoidoscopy) you may notice spotting of blood in your stool or on the toilet paper. If you underwent a bowel prep for your procedure, then you may not have a normal bowel movement for a few days.  DIET: Your first meal following the procedure should be a light meal and then it is ok to progress to your normal diet.  A half-sandwich or bowl of soup is an example of a good first meal.  Heavy or fried foods are harder to digest and may make you feel nauseous or bloated.  Likewise meals heavy in dairy and vegetables can cause extra gas to form and this can also increase the bloating.  Drink plenty of fluids but you should avoid alcoholic beverages for 24 hours.  ACTIVITY: Your care partner should take you home directly after the procedure.  You should plan to take it easy, moving slowly for the rest of the day.  You can resume normal activity the day after the procedure however you should NOT DRIVE or use heavy machinery for 24 hours (because of the sedation medicines used during the test).    SYMPTOMS TO REPORT IMMEDIATELY: A gastroenterologist can be reached at any hour.  During normal business hours, 8:30 AM to 5:00 PM Monday through Friday,  call [phone].  After hours and on weekends, please call the GI answering service at [phone] who will take a message and have the physician on call contact you.   Following lower endoscopy (colonoscopy or flexible sigmoidoscopy):  Excessive amounts of blood in the stool  Significant tenderness or worsening of abdominal pains  Swelling of the abdomen that is new, acute  Fever of 100F or higher  Following upper endoscopy (EGD)  Vomiting of blood or coffee ground material  New chest pain or pain under the shoulder blades  Painful or persistently difficult swallowing  New shortness of breath  Fever of 100F or higher  Black, tarry-looking stools  FOLLOW UP: If any biopsies were taken you will be contacted by phone or by letter within the next 1-3 weeks.  Call your gastroenterologist if you have not heard about the biopsies in 3 weeks.  Our staff will call the home number listed on your records the next business day following your procedure to check on you and address any questions or concerns that you may have at that time regarding the information given to you following your procedure. This is a courtesy call and so if there is no answer at the home number and we have not heard from you through the emergency physician on call, we will assume that you have returned to your regular daily activities without incident.  SIGNATURES/CONFIDENTIALITY: You and/or your care   partner have signed paperwork which will be entered into your electronic medical record.  These signatures attest to the fact that that the information above on your After Visit Summary has been reviewed and is understood.  Full responsibility of the confidentiality of this discharge information lies with you and/or your care-partner.  The 3rd floor staff will arrange for you to have a CT of the abd and pelvis with contrast.   I will give you the contrast and they will tell you the time and the times to take the  contrast.

## 2013-06-04 ENCOUNTER — Telehealth: Payer: Self-pay | Admitting: *Deleted

## 2013-06-04 NOTE — Telephone Encounter (Signed)
Message left

## 2013-06-06 ENCOUNTER — Other Ambulatory Visit: Payer: BC Managed Care – PPO

## 2013-06-10 ENCOUNTER — Ambulatory Visit: Payer: BC Managed Care – PPO

## 2013-06-12 ENCOUNTER — Other Ambulatory Visit: Payer: BC Managed Care – PPO

## 2013-06-17 ENCOUNTER — Other Ambulatory Visit: Payer: BC Managed Care – PPO

## 2013-06-24 ENCOUNTER — Ambulatory Visit: Payer: BC Managed Care – PPO

## 2013-07-09 ENCOUNTER — Encounter: Payer: Self-pay | Admitting: Nurse Practitioner

## 2013-07-10 ENCOUNTER — Encounter: Payer: Self-pay | Admitting: Nurse Practitioner

## 2013-08-01 ENCOUNTER — Other Ambulatory Visit: Payer: Self-pay

## 2013-08-01 MED ORDER — ATORVASTATIN CALCIUM 40 MG PO TABS: 40.0000 mg | ORAL_TABLET | Freq: Every day | ORAL | Status: DC

## 2013-08-04 ENCOUNTER — Encounter: Payer: Self-pay | Admitting: Nurse Practitioner

## 2013-08-30 ENCOUNTER — Other Ambulatory Visit: Payer: Self-pay | Admitting: Family Medicine

## 2013-09-23 ENCOUNTER — Ambulatory Visit (INDEPENDENT_AMBULATORY_CARE_PROVIDER_SITE_OTHER): Payer: BC Managed Care – PPO | Admitting: Nurse Practitioner

## 2013-09-23 ENCOUNTER — Encounter: Payer: Self-pay | Admitting: Nurse Practitioner

## 2013-09-23 VITALS — BP 142/96 | HR 117 | Temp 98.1°F | Ht 65.0 in | Wt 253.6 lb

## 2013-09-23 DIAGNOSIS — L03119 Cellulitis of unspecified part of limb: Secondary | ICD-10-CM

## 2013-09-23 DIAGNOSIS — L02419 Cutaneous abscess of limb, unspecified: Secondary | ICD-10-CM

## 2013-09-23 DIAGNOSIS — L03116 Cellulitis of left lower limb: Secondary | ICD-10-CM

## 2013-09-23 MED ORDER — SULFAMETHOXAZOLE-TMP DS 800-160 MG PO TABS: 1.0000 | ORAL_TABLET | Freq: Two times a day (BID) | ORAL | Status: DC

## 2013-09-23 MED ORDER — HYDROCODONE-ACETAMINOPHEN 5-325 MG PO TABS: 1.0000 | ORAL_TABLET | Freq: Four times a day (QID) | ORAL | Status: DC | PRN

## 2013-09-23 MED ORDER — FLUCONAZOLE 150 MG PO TABS: ORAL_TABLET | ORAL | Status: DC

## 2013-09-23 NOTE — Progress Notes (Signed)
   Subjective:    Patient ID: Amanda Kelly, female    DOB: 1960/09/02, 53 y.o.   MRN: [id number]  Leg Pain  The incident occurred 12 to 24 hours ago. The incident occurred at home. There was no injury mechanism. The pain is present in the left leg. The quality of the pain is described as burning and cramping. The pain is at a severity of 8/10. The pain is moderate. The pain has been constant since onset. Pertinent negatives include no inability to bear weight, muscle weakness, numbness or tingling. She reports no foreign bodies present. The symptoms are aggravated by movement. She has tried nothing for the symptoms.      Review of Systems  Constitutional: Negative.   HENT: Negative.   Eyes: Negative.   Respiratory: Negative.   Cardiovascular: Negative.   Gastrointestinal: Negative.   Endocrine: Negative.   Genitourinary: Negative.   Musculoskeletal:       Left leg pain.   Allergic/Immunologic: Negative.   Neurological: Negative.  Negative for tingling and numbness.  Hematological: Negative.   Psychiatric/Behavioral: Negative.        Objective:   Physical Exam  Constitutional: She is oriented to person, place, and time. She appears well-developed.  HENT:  Head: Normocephalic.  Eyes: Pupils are equal, round, and reactive to light.  Neck: Normal range of motion.  Cardiovascular: Normal rate.   Pulmonary/Chest: Effort normal.  Abdominal: Soft.  Genitourinary: Vagina normal.  Musculoskeletal: Normal range of motion. She exhibits edema and tenderness.  Left leg pain with redness.   Neurological: She is alert and oriented to person, place, and time.  Skin: Skin is warm and dry.    BP 142/96  Pulse 117  Temp(Src) 98.1 F (36.7 C) (Oral)  Ht 5\' 5"  (1.651 m)  Wt 253 lb 9.6 oz (115.032 kg)  BMI 42.20 kg/m2      Assessment & Plan:  1. Cellulitis of leg, left Elevate leg Cool compresses Meds ordered this encounter  Medications  . metoprolol succinate (TOPROL-XL) 50 MG  24 hr tablet    Sig: Take 50 mg by mouth daily. Take with or immediately following a meal.  . sulfamethoxazole-trimethoprim (BACTRIM DS) 800-160 MG per tablet    Sig: Take 1 tablet by mouth 2 (two) times daily.    Dispense:  20 tablet    Refill:  0    Order Specific Question:  Supervising Provider    Answer:  Chipper Herb [[id number]]  . fluconazole (DIFLUCAN) 150 MG tablet    Sig: 1 PO NOW AND REPEAT IN 1 WEEK    Dispense:  2 tablet    Refill:  0    Order Specific Question:  Supervising Provider    Answer:  Chipper Herb [[id number]]  . HYDROcodone-acetaminophen (LORTAB) 5-325 MG per tablet    Sig: Take 1 tablet by mouth every 6 (six) hours as needed for moderate pain.    Dispense:  30 tablet    Refill:  0    Order Specific Question:  Supervising Provider    Answer:  Chipper Herb [[id number]]   RTO prn  Mary-Margaret Hassell Done, FNP

## 2013-09-23 NOTE — Patient Instructions (Signed)
Cellulitis Cellulitis is an infection of the skin and the tissue beneath it. The infected area is usually red and tender. Cellulitis occurs most often in the arms and lower legs.  CAUSES  Cellulitis is caused by bacteria that enter the skin through cracks or cuts in the skin. The most common types of bacteria that cause cellulitis are Staphylococcus and Streptococcus. SYMPTOMS   Redness and warmth.  Swelling.  Tenderness or pain.  Fever. DIAGNOSIS  Your caregiver can usually determine what is wrong based on a physical exam. Blood tests may also be done. TREATMENT  Treatment usually involves taking an antibiotic medicine. HOME CARE INSTRUCTIONS   Take your antibiotics as directed. Finish them even if you start to feel better.  Keep the infected arm or leg elevated to reduce swelling.  Apply a warm cloth to the affected area up to 4 times per day to relieve pain.  Only take over-the-counter or prescription medicines for pain, discomfort, or fever as directed by your caregiver.  Keep all follow-up appointments as directed by your caregiver. SEEK MEDICAL CARE IF:   You notice red streaks coming from the infected area.  Your red area gets larger or turns dark in color.  Your bone or joint underneath the infected area becomes painful after the skin has healed.  Your infection returns in the same area or another area.  You notice a swollen bump in the infected area.  You develop new symptoms. SEEK IMMEDIATE MEDICAL CARE IF:   You have a fever.  You feel very sleepy.  You develop vomiting or diarrhea.  You have a general ill feeling (malaise) with muscle aches and pains. MAKE SURE YOU:   Understand these instructions.  Will watch your condition.  Will get help right away if you are not doing well or get worse. Document Released: 12/21/2004 Document Revised: 09/12/2011 Document Reviewed: 05/29/2011 ExitCare Patient Information 2015 [employer]. This information is  not intended to replace advice given to you by your health care provider. Make sure you discuss any questions you have with your health care provider.  

## 2013-09-23 NOTE — Addendum Note (Signed)
Addended by: Chevis Pretty on: 09/23/2013 03:49 PM   Modules accepted: Orders

## 2013-09-24 ENCOUNTER — Encounter (HOSPITAL_BASED_OUTPATIENT_CLINIC_OR_DEPARTMENT_OTHER): Payer: Self-pay | Admitting: Emergency Medicine

## 2013-09-24 ENCOUNTER — Telehealth: Payer: Self-pay | Admitting: Nurse Practitioner

## 2013-09-24 ENCOUNTER — Emergency Department (HOSPITAL_BASED_OUTPATIENT_CLINIC_OR_DEPARTMENT_OTHER)
Admission: EM | Admit: 2013-09-24 | Discharge: 2013-09-24 | Disposition: A | Discharge status: Home / Self Care | Payer: BC Managed Care – PPO | Attending: Emergency Medicine | Admitting: Emergency Medicine

## 2013-09-24 DIAGNOSIS — E785 Hyperlipidemia, unspecified: Secondary | ICD-10-CM | POA: Insufficient documentation

## 2013-09-24 DIAGNOSIS — R509 Fever, unspecified: Secondary | ICD-10-CM | POA: Insufficient documentation

## 2013-09-24 DIAGNOSIS — L03119 Cellulitis of unspecified part of limb: Principal | ICD-10-CM

## 2013-09-24 DIAGNOSIS — Z87891 Personal history of nicotine dependence: Secondary | ICD-10-CM | POA: Insufficient documentation

## 2013-09-24 DIAGNOSIS — F341 Dysthymic disorder: Secondary | ICD-10-CM | POA: Insufficient documentation

## 2013-09-24 DIAGNOSIS — Z792 Long term (current) use of antibiotics: Secondary | ICD-10-CM | POA: Insufficient documentation

## 2013-09-24 DIAGNOSIS — R42 Dizziness and giddiness: Secondary | ICD-10-CM | POA: Insufficient documentation

## 2013-09-24 DIAGNOSIS — I10 Essential (primary) hypertension: Secondary | ICD-10-CM | POA: Insufficient documentation

## 2013-09-24 DIAGNOSIS — L02419 Cutaneous abscess of limb, unspecified: Secondary | ICD-10-CM | POA: Insufficient documentation

## 2013-09-24 DIAGNOSIS — Z79899 Other long term (current) drug therapy: Secondary | ICD-10-CM | POA: Insufficient documentation

## 2013-09-24 DIAGNOSIS — L03116 Cellulitis of left lower limb: Secondary | ICD-10-CM

## 2013-09-24 DIAGNOSIS — R000 Tachycardia, unspecified: Secondary | ICD-10-CM | POA: Insufficient documentation

## 2013-09-24 LAB — BASIC METABOLIC PANEL
ANION GAP: 17 — AB (ref 5–15)
BUN: 9 mg/dL (ref 6–23)
CHLORIDE: 96 meq/L (ref 96–112)
CO2: 24 mEq/L (ref 19–32)
Calcium: 10 mg/dL (ref 8.4–10.5)
Creatinine, Ser: 0.8 mg/dL (ref 0.50–1.10)
GFR, EST NON AFRICAN AMERICAN: 85 mL/min — AB (ref >90)
Glucose, Bld: 99 mg/dL (ref 70–99)
Potassium: 4 mEq/L (ref 3.7–5.3)
Sodium: 137 mEq/L (ref 137–147)

## 2013-09-24 MED ORDER — AMOXICILLIN-POT CLAVULANATE 875-125 MG PO TABS: 1.0000 | ORAL_TABLET | Freq: Two times a day (BID) | ORAL | Status: DC

## 2013-09-24 MED ORDER — DIPHENHYDRAMINE HCL 50 MG/ML IJ SOLN
25.0000 mg | Freq: Once | INTRAMUSCULAR | Status: AC
  Administered 2013-09-24: 25 mg via INTRAVENOUS
  Filled 2013-09-24: qty 1

## 2013-09-24 MED ORDER — VANCOMYCIN HCL IN DEXTROSE 1-5 GM/200ML-% IV SOLN: 1000.0000 mg | Freq: Two times a day (BID) | INTRAVENOUS | Status: DC

## 2013-09-24 MED ORDER — VANCOMYCIN HCL 10 G IV SOLR
2000.0000 mg | INTRAVENOUS | Status: DC
  Filled 2013-09-24: qty 2000

## 2013-09-24 MED ORDER — VANCOMYCIN HCL 500 MG IV SOLR
INTRAVENOUS | Status: AC
  Administered 2013-09-24: 2000 mg via INTRAVENOUS
  Filled 2013-09-24: qty 2000

## 2013-09-24 MED ORDER — HYDROCODONE-ACETAMINOPHEN 5-325 MG PO TABS: 1.0000 | ORAL_TABLET | ORAL | Status: DC | PRN

## 2013-09-24 MED ORDER — SODIUM CHLORIDE 0.9 % IV BOLUS (SEPSIS)
1000.0000 mL | Freq: Once | INTRAVENOUS | Status: AC
  Administered 2013-09-24: 1000 mL via INTRAVENOUS

## 2013-09-24 NOTE — Telephone Encounter (Signed)
Per provider,  Go to ER for possible IV and injection of antibiotics.  Call to let us know how you are feeling.

## 2013-09-24 NOTE — ED Notes (Signed)
Pt c/o redness , pain and swelling to left leg  x 3 days

## 2013-09-24 NOTE — Progress Notes (Signed)
ANTIBIOTIC CONSULT NOTE - INITIAL  Pharmacy Consult for Vancomycin Indication: cellulitis  No Known Allergies  Patient Measurements: Height: 5\' 5"  (165.1 cm) Weight: 253 lb 14.4 oz (115.168 kg) IBW/kg (Calculated) : 57  Vital Signs: Temp: 99.4 F (37.4 C) (07/01 1754) Temp src: Oral (07/01 1754) BP: 157/82 mmHg (07/01 1754) Pulse Rate: 120 (07/01 1754) Intake/Output from previous day:   Intake/Output from this shift:    Labs: No results found for this basename: WBC, HGB, PLT, LABCREA, CREATININE,  in the last 72 hours Estimated Creatinine Clearance: 106.6 ml/min (by C-G formula based on Cr of 0.65). No results found for this basename: VANCOTROUGH, VANCOPEAK, VANCORANDOM, GENTTROUGH, GENTPEAK, GENTRANDOM, TOBRATROUGH, TOBRAPEAK, TOBRARND, AMIKACINPEAK, AMIKACINTROU, AMIKACIN,  in the last 72 hours   Microbiology: No results found for this or any previous visit (from the past 720 hour(s)).  Medical History: Past Medical History  Diagnosis Date  . ANXIETY DEPRESSION   . SMOKER     former  . TACHYCARDIA   . Palpitations   . Shortness of breath   . Hypertension   . Hyperlipidemia     Medications:  See electronic med rec  Assessment: 53 y.o. female presents with pain, swelling to L leg x 3 days. To begin vancomycin for cellulitis. Pt with CrCl > 100 ml/min.  Goal of Therapy:  Vancomycin trough level 10-15 mcg/ml  Plan:  1. Vancomycin 2gm IV now then 1gm IV q12h 2. Will f/u micro data, pt's clinical condition, renal function, trough prn  Sherlon Handing, PharmD, BCPS Clinical pharmacist, pager [phone] 09/24/2013,6:33 PM

## 2013-09-24 NOTE — Discharge Instructions (Signed)
Infection may take 48 hours to look considerably better. Recheck here if not improving in the next 48-72 hours. Or any time if worse.  Cellulitis Cellulitis is an infection of the skin and the tissue under the skin. The infected area is usually red and tender. This happens most often in the arms and lower legs. HOME CARE   Take your antibiotic medicine as told. Finish the medicine even if you start to feel better.  Keep the infected arm or leg raised (elevated).  Put a warm cloth on the area up to 4 times per day.  Only take medicines as told by your doctor.  Keep all doctor visits as told. GET HELP RIGHT AWAY IF:   You have a fever.  You feel very sleepy.  You throw up (vomit) or have watery poop (diarrhea).  You feel sick and have muscle aches and pains.  You see red streaks on the skin coming from the infected area.  Your red area gets bigger or turns a dark color.  Your bone or joint under the infected area is painful after the skin heals.  Your infection comes back in the same area or different area.  You have a puffy (swollen) bump in the infected area.  You have new symptoms. MAKE SURE YOU:   Understand these instructions.  Will watch your condition.  Will get help right away if you are not doing well or get worse. Document Released: 08/30/2007 Document Revised: 09/12/2011 Document Reviewed: 05/29/2011 [facility] Patient Information 2015 [place], [place]. This information is not intended to replace advice given to you by your health care provider. Make sure you discuss any questions you have with your health care provider.

## 2013-09-24 NOTE — ED Provider Notes (Signed)
CSN: [id number]     Arrival date & time 09/24/13  1749 History  This chart was scribed for Tanna Furry, MD by Ludger Nutting, ED Scribe. This patient was seen in room MH02/MH02 and the patient's care was started 6:24 PM.     Chief Complaint  Patient presents with  . Leg Pain      The history is provided by the patient. No language interpreter was used.   HPI Comments: Amanda Kelly is a 53 y.o. female with past medical history of HTN, HLD who presents to the Emergency Department complaining of 2-3 days of constant left lower leg pain and redness that worsened today. Patient states she also noticed associated swelling to the affected area along with associated fever, chills, and lightheadedness today. Patient states she was seen by her PCP yesterday and was prescribed Bactrim. She reports taking 3 doses of this medication and denies any known allergies. She denies nausea, vomiting.   Past Medical History  Diagnosis Date  . ANXIETY DEPRESSION   . SMOKER     former  . TACHYCARDIA   . Palpitations   . Shortness of breath   . Hypertension   . Hyperlipidemia    Past Surgical History  Procedure Laterality Date  . Breast mass excision Left    Family History  Problem Relation Age of Onset  . Breast cancer Maternal Grandmother    History  Substance Use Topics  . Smoking status: Former Smoker    Types: Cigarettes    Quit date: 04/02/2012  . Smokeless tobacco: Never Used  . Alcohol Use: Yes     Comment: 1 per day   OB History   Grav Para Term Preterm Abortions TAB SAB Ect Mult Living                 Review of Systems  Constitutional: Positive for fever and chills.  HENT: Negative.   Eyes: Negative.   Respiratory: Negative.   Cardiovascular: Negative.   Gastrointestinal: Negative.  Negative for nausea and vomiting.  Musculoskeletal: Positive for myalgias (LLE).  Skin: Positive for color change (redness to LLE).  Neurological: Positive for light-headedness.   Psychiatric/Behavioral: Negative.   All other systems reviewed and are negative.     Allergies  Review of patient's allergies indicates no known allergies.  Home Medications   Prior to Admission medications   Medication Sig Start Date End Date Taking? Authorizing Provider  amoxicillin-clavulanate (AUGMENTIN) 875-125 MG per tablet Take 1 tablet by mouth 2 (two) times daily. 09/24/13   Tanna Furry, MD  atorvastatin (LIPITOR) 40 MG tablet Take 1 tablet (40 mg total) by mouth daily. 08/01/13   Mary-Margaret Hassell Done, FNP  DULoxetine (CYMBALTA) 60 MG capsule TAKE 1 CAPSULE (60 MG TOTAL) BY MOUTH DAILY.    Mary-Margaret Hassell Done, FNP  fluconazole (DIFLUCAN) 150 MG tablet 1 PO NOW AND REPEAT IN 1 WEEK 09/23/13   Mary-Margaret Hassell Done, FNP  HYDROcodone-acetaminophen (LORTAB) 5-325 MG per tablet Take 1 tablet by mouth every 6 (six) hours as needed for moderate pain. 09/23/13   Mary-Margaret Hassell Done, FNP  HYDROcodone-acetaminophen (NORCO/VICODIN) 5-325 MG per tablet Take 1 tablet by mouth every 4 (four) hours as needed. 09/24/13   Tanna Furry, MD  metoprolol succinate (TOPROL-XL) 50 MG 24 hr tablet Take 1 tablet (50 mg total) by mouth daily. Take with or immediately following a meal. 09/18/12 09/18/13  Josue Hector, MD  metoprolol succinate (TOPROL-XL) 50 MG 24 hr tablet Take 50 mg by mouth daily. Take with  or immediately following a meal.    Historical Provider, MD  omeprazole (PRILOSEC OTC) 20 MG tablet Take 20 mg by mouth daily.    Historical Provider, MD  sulfamethoxazole-trimethoprim (BACTRIM DS) 800-160 MG per tablet Take 1 tablet by mouth 2 (two) times daily. 09/23/13   Mary-Margaret Hassell Done, FNP   BP 157/82  Pulse 120  Temp(Src) 99.4 F (37.4 C) (Oral)  Resp 18  Ht 5\' 5"  (1.651 m)  Wt 253 lb 14.4 oz (115.168 kg)  BMI 42.25 kg/m2  SpO2 99% Physical Exam  Nursing note and vitals reviewed. Constitutional: She is oriented to person, place, and time. She appears well-developed and well-nourished. No  distress.  HENT:  Head: Normocephalic.  Eyes: Conjunctivae are normal. Pupils are equal, round, and reactive to light. No scleral icterus.  Neck: Normal range of motion. Neck supple. No thyromegaly present.  Cardiovascular: Regular rhythm.  Tachycardia present.  Exam reveals no gallop and no friction rub.   No murmur heard. Pulmonary/Chest: Effort normal and breath sounds normal. No respiratory distress. She has no wheezes. She has no rales.  Abdominal: Soft. Bowel sounds are normal. She exhibits no distension. There is no tenderness. There is no rebound.  Musculoskeletal: Normal range of motion.  Neurological: She is alert and oriented to person, place, and time.  Skin: Skin is warm and dry. There is erythema.  Erythema, warmth, and tenderness diffusely to the left anterior lower leg. No increased circumference of the left lower leg.   Psychiatric: She has a normal mood and affect. Her behavior is normal.    ED Course  Procedures (including critical care time)  DIAGNOSTIC STUDIES: Oxygen Saturation is 99% on RA, normal by my interpretation.    COORDINATION OF CARE: 6:29 PM Will give IV vancomycin, IV fluids, and benadryl. Will also order BMP. Discussed treatment plan with pt at bedside and pt agreed to plan.   Labs Review Labs Reviewed  BASIC METABOLIC PANEL - Abnormal; Notable for the following:    GFR calc non Af Amer 85 (*)    Anion gap 17 (*)    All other components within normal limits    Imaging Review No results found.   EKG Interpretation None      MDM   Final diagnoses:  Cellulitis of left lower extremity    And will be continuing her back. Add  Augmentin. Recheck here if not improving or any point if worse.  I personally performed the services described in this documentation, which was scribed in my presence. The recorded information has been reviewed and is accurate.   Tanna Furry, MD 09/24/13 2115

## 2013-09-30 ENCOUNTER — Other Ambulatory Visit: Payer: Self-pay | Admitting: Nurse Practitioner

## 2013-10-02 NOTE — Telephone Encounter (Signed)
Patient NTBS for follow up and lab work  

## 2013-10-02 NOTE — Telephone Encounter (Signed)
Last lipomed 12/14. Ntbs.

## 2013-10-07 ENCOUNTER — Other Ambulatory Visit: Payer: Self-pay | Admitting: Nurse Practitioner

## 2013-10-07 NOTE — Telephone Encounter (Signed)
Last ov 6/15 for cellulitis and 12/14 for checkup.

## 2013-11-06 ENCOUNTER — Other Ambulatory Visit: Payer: Self-pay | Admitting: Nurse Practitioner

## 2013-11-10 ENCOUNTER — Encounter: Payer: Self-pay | Admitting: Nurse Practitioner

## 2013-11-10 NOTE — Telephone Encounter (Signed)
I have sent a refill request over on all the boys and will contact patients mother when rx;'s are ready

## 2013-12-09 ENCOUNTER — Other Ambulatory Visit: Payer: Self-pay

## 2013-12-09 MED ORDER — METOPROLOL SUCCINATE ER 50 MG PO TB24: 50.0000 mg | ORAL_TABLET | Freq: Every day | ORAL | Status: DC

## 2013-12-12 ENCOUNTER — Encounter: Payer: Self-pay | Admitting: Nurse Practitioner

## 2014-01-08 ENCOUNTER — Encounter: Payer: Self-pay | Admitting: Nurse Practitioner

## 2014-01-09 ENCOUNTER — Other Ambulatory Visit: Payer: Self-pay

## 2014-02-09 ENCOUNTER — Encounter: Payer: Self-pay | Admitting: Nurse Practitioner

## 2014-03-06 ENCOUNTER — Other Ambulatory Visit: Payer: Self-pay | Admitting: Nurse Practitioner

## 2014-03-06 ENCOUNTER — Encounter: Payer: Self-pay | Admitting: Nurse Practitioner

## 2014-04-10 ENCOUNTER — Other Ambulatory Visit: Payer: Self-pay | Admitting: Nurse Practitioner

## 2014-05-01 ENCOUNTER — Other Ambulatory Visit: Payer: Self-pay | Admitting: Nurse Practitioner

## 2014-05-01 NOTE — Telephone Encounter (Signed)
no more refills without being seen  

## 2014-05-01 NOTE — Telephone Encounter (Signed)
No lipid/liver since 02/2013

## 2014-05-05 ENCOUNTER — Other Ambulatory Visit: Payer: Self-pay | Admitting: Nurse Practitioner

## 2014-05-06 ENCOUNTER — Other Ambulatory Visit: Payer: Self-pay

## 2014-05-06 DIAGNOSIS — Z1231 Encounter for screening mammogram for malignant neoplasm of breast: Secondary | ICD-10-CM

## 2014-05-11 ENCOUNTER — Ambulatory Visit: Payer: Self-pay

## 2014-05-19 ENCOUNTER — Ambulatory Visit
Admission: RE | Admit: 2014-05-19 | Discharge: 2014-05-19 | Disposition: A | Discharge status: Home / Self Care | Payer: BLUE CROSS/BLUE SHIELD | Source: Ambulatory Visit

## 2014-05-19 ENCOUNTER — Other Ambulatory Visit: Payer: Self-pay | Admitting: Obstetrics and Gynecology

## 2014-05-19 DIAGNOSIS — Z1231 Encounter for screening mammogram for malignant neoplasm of breast: Secondary | ICD-10-CM

## 2014-05-19 DIAGNOSIS — N63 Unspecified lump in unspecified breast: Secondary | ICD-10-CM

## 2014-05-25 ENCOUNTER — Other Ambulatory Visit: Payer: Self-pay | Admitting: Nurse Practitioner

## 2014-05-25 ENCOUNTER — Ambulatory Visit
Admission: RE | Admit: 2014-05-25 | Discharge: 2014-05-25 | Disposition: A | Discharge status: Home / Self Care | Payer: BLUE CROSS/BLUE SHIELD | Source: Ambulatory Visit | Attending: Obstetrics and Gynecology | Admitting: Obstetrics and Gynecology

## 2014-05-25 ENCOUNTER — Other Ambulatory Visit: Payer: Self-pay | Admitting: Obstetrics and Gynecology

## 2014-05-25 DIAGNOSIS — N63 Unspecified lump in unspecified breast: Secondary | ICD-10-CM

## 2014-05-25 DIAGNOSIS — R921 Mammographic calcification found on diagnostic imaging of breast: Secondary | ICD-10-CM

## 2014-06-02 ENCOUNTER — Other Ambulatory Visit: Payer: Self-pay | Admitting: Obstetrics and Gynecology

## 2014-06-02 DIAGNOSIS — N63 Unspecified lump in unspecified breast: Secondary | ICD-10-CM

## 2014-06-03 ENCOUNTER — Ambulatory Visit
Admission: RE | Admit: 2014-06-03 | Discharge: 2014-06-03 | Disposition: A | Discharge status: Home / Self Care | Payer: BLUE CROSS/BLUE SHIELD | Source: Ambulatory Visit | Attending: Obstetrics and Gynecology | Admitting: Obstetrics and Gynecology

## 2014-06-03 ENCOUNTER — Other Ambulatory Visit: Payer: Self-pay | Admitting: Obstetrics and Gynecology

## 2014-06-03 DIAGNOSIS — N63 Unspecified lump in unspecified breast: Secondary | ICD-10-CM

## 2014-06-03 DIAGNOSIS — R921 Mammographic calcification found on diagnostic imaging of breast: Secondary | ICD-10-CM

## 2014-06-26 ENCOUNTER — Other Ambulatory Visit: Payer: Self-pay | Admitting: Nurse Practitioner

## 2014-06-29 ENCOUNTER — Ambulatory Visit: Payer: Self-pay | Admitting: Surgery

## 2014-06-29 DIAGNOSIS — N6320 Unspecified lump in the left breast, unspecified quadrant: Secondary | ICD-10-CM

## 2014-06-29 NOTE — H&P (Signed)
Amanda Kelly [facility] 06/29/2014 9:46 AM Location: [facility] Patient #: [id number] DOB: 09-May-1960 Married / Language: English / Race: White Female History of Present Illness [name] A. Holliday Sheaffer MD; 06/29/2014 4:22 PM) Patient words: breast cancer       Pt present for left breast mass core bx showing biphasic lesion.    Pt had this discovered on mammogram. No symptoms.       CLINICAL DATA: Patient notes a palpable lump within the left breast for 2 months. The patient has a family history breast cancer in a grandmother at age 19 and 3 maternal cousins (premenopausal). She has undergone a previous left breast ultrasound-guided core biopsy in December, 2013 which demonstrated extensive stromal fibrosis. She has also undergone a previous benign left breast surgical excisional biopsy within the upper-outer quadrant of the left breast.  EXAM: DIGITAL DIAGNOSTIC bilateral MAMMOGRAM WITH 3D TOMOSYNTHESIS WITH CAD  ULTRASOUND left BREAST  COMPARISON: 03/26/2012, 03/14/2012, 03/01/2012, 05/12/2009, 05/11/2008.  ACR Breast Density Category c: The breast tissue is heterogeneously dense, which may obscure small masses.  FINDINGS: Two groups of calcifications are seen within the right breast 1 group is located inferiorly within the right breast at approximately the 6:30 o'clock position and spans 10 mm. The majority of calcifications within this group are punctate. There are several amorphous calcifications associated with this group. There is a second group of calcifications located within the upper inner quadrant of the right breast which span 11 mm. These vary in size and shape and are suspicious for possible DCIS. Tissue sampling via stereotactic core biopsy is recommended. This has been scheduled for 06/03/2014. Depending on the results of this biopsy, stereotactic biopsy of the second group of calcifications located at the 6:30 o'clock position may be  needed.  There is an oval, macrolobulated mass located within the upper inner quadrant of the left breast with a clip associated with the anterior portion of this mass. This mass has increased in size mammographically. There is distortion located within the upper outer quadrant left breast related to the patient's previous benign surgical excisional biopsy.  Mammographic images were processed with CAD.  On physical exam, there is a mobile, palpable mass located within the left breast at the 9:30 o'clock position 5 cm from the nipple. There is no palpable left axillary adenopathy.  Targeted ultrasound is performed, showing a heterogeneous mass with slightly irregular margins located within the left breast at the 9:30 o'clock position 5 cm from the nipple. This measures 2.1 x 1.4 x 1.6 cm in size. This corresponds to the palpable mass. Tissue sampling via ultrasound-guided core biopsy is recommended. This is scheduled for 06/03/2014.  Ultrasound of the left axilla demonstrates normal axillary contents and no evidence for adenopathy.  IMPRESSION: 1. 2.1 cm mass located within the upper inner quadrant left breast 9:30 o'clock position 5 cm from the nipple. Tissue sampling via ultrasound-guided core biopsy is recommended and is scheduled for 06/03/2014.  2. Two groups of calcifications located within the right breast as discussed above. Stereotactic core biopsy of the grouped calcifications located within the upper inner quadrant of the right breast is suggested and is scheduled for 06/03/2014. Depending on the results of this biopsy stereotactic core biopsy of the second group calcifications may be needed.  RECOMMENDATION: Left breast ultrasound-guided core biopsy and right breast stereotactic core biopsy.  I have discussed the findings and recommendations with the patient. Results were also provided in writing at the conclusion of the visit. If applicable, a reminder  letter  will be sent to the patient regarding the next appointment.  BI-RADS CATEGORY 4: Suspicious.   Electronically Signed By: Altamese Cabal M.D. On: 05/25/2014 12:50  CLINICAL DATA: 54 year old female status post ultrasound-guided biopsy of the left breast and stereotactic guided biopsy of the right breast  EXAM: DIAGNOSTIC BILATERAL MAMMOGRAM POST ULTRASOUND BIOPSY OF THE LEFT BREAST AND STEREOTACTIC BIOPSY OF THE RIGHT BREAST  COMPARISON: Previous exam(s).  FINDINGS: Mammographic images were obtained following ultrasound guided biopsy of the left breast and stereotactic guided biopsy of the right breast. Post biopsy mammogram confirms the X shaped clip to be within the expected position within the upper, inner right breast and the heart shaped clip to be within the expected position within the upper, inner left breast.  IMPRESSION: Satisfactory clip placement within both breasts as above.  Final Assessment: Post Procedure Mammograms for Marker Placement     Breast, left, needle core biopsy, 9:30 o'clock, 5 CMFN - BIPHASIC (STROMAL/EPITHELIAL) LESION. - SEE COMMENT.  The patient is a 54 year old female   Other Problems Elbert Ewings, CMA; 06/29/2014 9:46 AM) Anxiety Disorder Gastroesophageal Reflux Disease Lump In Breast  Past Surgical History Elbert Ewings, CMA; 06/29/2014 9:46 AM) Breast Biopsy multiple  Diagnostic Studies History Elbert Ewings, CMA; 06/29/2014 9:46 AM) Colonoscopy within last year Mammogram within last year Pap Smear 1-5 years ago  Allergies Elbert Ewings, CMA; 06/29/2014 9:47 AM) No Known Drug Allergies 06/29/2014  Medication History Elbert Ewings, CMA; 06/29/2014 9:48 AM) Atorvastatin Calcium (40MG  Tablet, Oral) Active. DULoxetine HCl (60MG  Capsule DR Part, Oral) Active. Metoprolol Succinate ER (50MG  Tablet ER 24HR, Oral) Active. Omeprazole (20MG  Capsule DR, Oral) Active. Diflucan (150MG  Tablet, Oral) Active. Medications  Reconciled Hydrocodone-Acetaminophen (2.5-500MG  Tablet, Oral) Active.  Social History Elbert Ewings, [place]; 06/29/2014 9:46 AM) Alcohol use Occasional alcohol use. Caffeine use Coffee. No drug use Tobacco use Former smoker.  Family History Elbert Ewings, [place]; 06/29/2014 9:46 AM) Breast Cancer Family Members In General.  Pregnancy / Birth History Elbert Ewings, CMA; 06/29/2014 9:46 AM) Age at menarche 19 years. Age of menopause 24-50 Gravida 0 Irregular periods Para 0     Review of Systems Elbert Ewings CMA; 06/29/2014 9:46 AM) General Present- Fatigue and Night Sweats. Not Present- Appetite Loss, Chills, Fever, Weight Gain and Weight Loss. Skin Not Present- Change in Wart/Mole, Dryness, Hives, Jaundice, New Lesions, Non-Healing Wounds, Rash and Ulcer. HEENT Present- Wears glasses/contact lenses. Not Present- Earache, Hearing Loss, Hoarseness, Nose Bleed, Oral Ulcers, Ringing in the Ears, Seasonal Allergies, Sinus Pain, Sore Throat, Visual Disturbances and Yellow Eyes. Respiratory Not Present- Bloody sputum, Chronic Cough, Difficulty Breathing, Snoring and Wheezing. Breast Present- Breast Mass. Not Present- Breast Pain, Nipple Discharge and Skin Changes. Cardiovascular Present- Rapid Heart Rate. Not Present- Chest Pain, Difficulty Breathing Lying Down, Leg Cramps, Palpitations, Shortness of Breath and Swelling of Extremities. Gastrointestinal Not Present- Abdominal Pain, Bloating, Bloody Stool, Change in Bowel Habits, Chronic diarrhea, Constipation, Difficulty Swallowing, Excessive gas, Gets full quickly at meals, Hemorrhoids, Indigestion, Nausea, Rectal Pain and Vomiting. Female Genitourinary Not Present- Frequency, Nocturia, Painful Urination, Pelvic Pain and Urgency. Musculoskeletal Not Present- Back Pain, Joint Pain, Joint Stiffness, Muscle Pain, Muscle Weakness and Swelling of Extremities. Neurological Present- Numbness and Tingling. Not Present- Decreased Memory, Fainting,  Headaches, Seizures, Tremor, Trouble walking and Weakness. Psychiatric Present- Anxiety. Not Present- Bipolar, Change in Sleep Pattern, Depression, Fearful and Frequent crying. Endocrine Present- Hot flashes. Not Present- Cold Intolerance, Excessive Hunger, Hair Changes, Heat Intolerance and New Diabetes.  Vitals Elbert Ewings  CMA; 06/29/2014 9:49 AM) 06/29/2014 9:48 AM Weight: 247 lb Height: 65in Body Surface Area: 2.27 m Body Mass Index: 41.1 kg/m Temp.: 97.97F(Temporal)  Pulse: 102 (Regular)  Resp.: 18 (Unlabored)  BP: 150/82 (Sitting, Left Arm, Standard)     Physical Exam (Finnley Larusso A. Aubrianna Orchard MD; 06/29/2014 4:22 PM)  General Mental Status-Alert. General Appearance-Consistent with stated age. Hydration-Well hydrated. Voice-Normal.  Head and Neck Head-normocephalic, atraumatic with no lesions or palpable masses. Trachea-midline. Thyroid Gland Characteristics - normal size and consistency.  Eye Eyeball - Bilateral-Extraocular movements intact. Sclera/Conjunctiva - Bilateral-No scleral icterus.  Chest and Lung Exam Chest and lung exam reveals -quiet, even and easy respiratory effort with no use of accessory muscles and on auscultation, normal breath sounds, no adventitious sounds and normal vocal resonance. Inspection Chest Wall - Normal. Back - normal.  Breast Breast - Left-Symmetric, Non Tender, No Biopsy scars, no Dimpling, No Inflammation, No Lumpectomy scars, No Mastectomy scars, No Peau d' [place]. Breast - Right-Symmetric, Non Tender, No Biopsy scars, no Dimpling, No Inflammation, No Lumpectomy scars, No Mastectomy scars, No Peau d' Orange. Breast Lump-No Palpable Breast Mass.  Cardiovascular Cardiovascular examination reveals -normal heart sounds, regular rate and rhythm with no murmurs and normal pedal pulses bilaterally.  Abdomen Inspection Inspection of the abdomen reveals - No Hernias. Skin - Scar - no surgical  scars. Palpation/Percussion Palpation and Percussion of the abdomen reveal - Soft, Non Tender, No Rebound tenderness, No Rigidity (guarding) and No hepatosplenomegaly. Auscultation Auscultation of the abdomen reveals - Bowel sounds normal.  Neurologic Neurologic evaluation reveals -alert and oriented x 3 with no impairment of recent or remote memory. Mental Status-Normal.  Musculoskeletal Normal Exam - Left-Upper Extremity Strength Normal and Lower Extremity Strength Normal. Normal Exam - Right-Upper Extremity Strength Normal and Lower Extremity Strength Normal.  Lymphatic Head & Neck  General Head & Neck Lymphatics: Bilateral - Description - Normal. Axillary  General Axillary Region: Bilateral - Description - Normal. Tenderness - Non Tender. Femoral & Inguinal  Generalized Femoral & Inguinal Lymphatics: Bilateral - Description - Normal. Tenderness - Non Tender.    Assessment & Plan (Wilba Mutz A. Noma Quijas MD; 06/29/2014 4:22 PM)  LEFT BREAST MASS (611.72  N63) Impression: biphasic lesion 2.1 cm. recommend excision to exclude phylloides tumor. Risk of lumpectomy include bleeding, infection, seroma, more surgery, use of seed/wire, wound care, cosmetic deformity and the need for other treatments, death , blood clots, death. Pt agrees to proceed.  Current Plans Pt Education - [facility] Breast Biopsy [facility]

## 2014-07-02 ENCOUNTER — Encounter (HOSPITAL_BASED_OUTPATIENT_CLINIC_OR_DEPARTMENT_OTHER): Payer: Self-pay | Admitting: *Deleted

## 2014-07-02 ENCOUNTER — Other Ambulatory Visit: Payer: Self-pay | Admitting: Surgery

## 2014-07-02 DIAGNOSIS — N6320 Unspecified lump in the left breast, unspecified quadrant: Secondary | ICD-10-CM

## 2014-07-02 NOTE — Progress Notes (Signed)
   07/02/14 1424  OBSTRUCTIVE SLEEP APNEA  Have you ever been diagnosed with sleep apnea through a sleep study? No  Do you snore loudly (loud enough to be heard through closed doors)?  1  Do you often feel tired, fatigued, or sleepy during the daytime? 0  Has anyone observed you stop breathing during your sleep? 0  Do you have, or are you being treated for high blood pressure? 1  BMI more than 35 kg/m2? 1  Age over 52 years old? 1  Gender: 0

## 2014-07-02 NOTE — Progress Notes (Signed)
Will come in for labs and ekg after seeds 4/11 Denies any resp problems or sleep apnea

## 2014-07-06 ENCOUNTER — Other Ambulatory Visit: Payer: Self-pay

## 2014-07-06 ENCOUNTER — Other Ambulatory Visit: Payer: Self-pay | Admitting: Nurse Practitioner

## 2014-07-06 ENCOUNTER — Encounter (HOSPITAL_BASED_OUTPATIENT_CLINIC_OR_DEPARTMENT_OTHER)
Admission: RE | Admit: 2014-07-06 | Discharge: 2014-07-06 | Disposition: A | Discharge status: Home / Self Care | Payer: BLUE CROSS/BLUE SHIELD | Source: Ambulatory Visit

## 2014-07-06 ENCOUNTER — Ambulatory Visit
Admission: RE | Admit: 2014-07-06 | Discharge: 2014-07-06 | Disposition: A | Discharge status: Home / Self Care | Payer: BLUE CROSS/BLUE SHIELD | Source: Ambulatory Visit | Attending: Surgery | Admitting: Surgery

## 2014-07-06 DIAGNOSIS — N6320 Unspecified lump in the left breast, unspecified quadrant: Secondary | ICD-10-CM

## 2014-07-06 DIAGNOSIS — N63 Unspecified lump in breast: Secondary | ICD-10-CM | POA: Diagnosis present

## 2014-07-06 DIAGNOSIS — K219 Gastro-esophageal reflux disease without esophagitis: Secondary | ICD-10-CM | POA: Diagnosis not present

## 2014-07-06 DIAGNOSIS — E669 Obesity, unspecified: Secondary | ICD-10-CM | POA: Diagnosis not present

## 2014-07-06 DIAGNOSIS — D4862 Neoplasm of uncertain behavior of left breast: Secondary | ICD-10-CM | POA: Diagnosis not present

## 2014-07-06 DIAGNOSIS — F419 Anxiety disorder, unspecified: Secondary | ICD-10-CM | POA: Diagnosis not present

## 2014-07-06 DIAGNOSIS — Z87891 Personal history of nicotine dependence: Secondary | ICD-10-CM | POA: Diagnosis not present

## 2014-07-06 DIAGNOSIS — Z79899 Other long term (current) drug therapy: Secondary | ICD-10-CM | POA: Diagnosis not present

## 2014-07-06 DIAGNOSIS — Z803 Family history of malignant neoplasm of breast: Secondary | ICD-10-CM | POA: Diagnosis not present

## 2014-07-06 LAB — CBC WITH DIFFERENTIAL/PLATELET
BASOS ABS: 0 10*3/uL (ref 0.0–0.1)
Basophils Relative: 0 % (ref 0–1)
EOS PCT: 1 % (ref 0–5)
Eosinophils Absolute: 0.1 10*3/uL (ref 0.0–0.7)
HCT: 39.7 % (ref 36.0–46.0)
HEMOGLOBIN: 13.3 g/dL (ref 12.0–15.0)
LYMPHS ABS: 2.2 10*3/uL (ref 0.7–4.0)
Lymphocytes Relative: 32 % (ref 12–46)
MCH: 32.8 pg (ref 26.0–34.0)
MCHC: 33.5 g/dL (ref 30.0–36.0)
MCV: 98 fL (ref 78.0–100.0)
MONO ABS: 0.4 10*3/uL (ref 0.1–1.0)
Monocytes Relative: 6 % (ref 3–12)
Neutro Abs: 4.3 10*3/uL (ref 1.7–7.7)
Neutrophils Relative %: 61 % (ref 43–77)
Platelets: 293 10*3/uL (ref 150–400)
RBC: 4.05 MIL/uL (ref 3.87–5.11)
RDW: 12.4 % (ref 11.5–15.5)
WBC: 7 10*3/uL (ref 4.0–10.5)

## 2014-07-06 LAB — COMPREHENSIVE METABOLIC PANEL
ALBUMIN: 4.1 g/dL (ref 3.5–5.2)
ALT: 49 U/L — ABNORMAL HIGH (ref 0–35)
AST: 47 U/L — ABNORMAL HIGH (ref 0–37)
Alkaline Phosphatase: 96 U/L (ref 39–117)
Anion gap: 12 (ref 5–15)
BUN: 11 mg/dL (ref 6–23)
CALCIUM: 9.3 mg/dL (ref 8.4–10.5)
CHLORIDE: 99 mmol/L (ref 96–112)
CO2: 25 mmol/L (ref 19–32)
Creatinine, Ser: 0.79 mg/dL (ref 0.50–1.10)
Glucose, Bld: 104 mg/dL — ABNORMAL HIGH (ref 70–99)
Potassium: 4.2 mmol/L (ref 3.5–5.1)
Sodium: 136 mmol/L (ref 135–145)
Total Bilirubin: 0.6 mg/dL (ref 0.3–1.2)
Total Protein: 7.3 g/dL (ref 6.0–8.3)

## 2014-07-06 NOTE — Telephone Encounter (Signed)
Last seen 08/2013

## 2014-07-07 ENCOUNTER — Encounter (HOSPITAL_BASED_OUTPATIENT_CLINIC_OR_DEPARTMENT_OTHER): Payer: Self-pay | Admitting: *Deleted

## 2014-07-07 ENCOUNTER — Ambulatory Visit (HOSPITAL_BASED_OUTPATIENT_CLINIC_OR_DEPARTMENT_OTHER): Payer: BLUE CROSS/BLUE SHIELD | Admitting: Certified Registered"

## 2014-07-07 ENCOUNTER — Ambulatory Visit
Admit: 2014-07-07 | Discharge: 2014-07-07 | Disposition: A | Discharge status: Home / Self Care | Payer: BLUE CROSS/BLUE SHIELD | Attending: Surgery | Admitting: Surgery

## 2014-07-07 ENCOUNTER — Ambulatory Visit (HOSPITAL_BASED_OUTPATIENT_CLINIC_OR_DEPARTMENT_OTHER)
Admission: RE | Admit: 2014-07-07 | Discharge: 2014-07-07 | Disposition: A | Discharge status: Home / Self Care | Payer: BLUE CROSS/BLUE SHIELD | Source: Ambulatory Visit | Attending: Surgery | Admitting: Surgery

## 2014-07-07 ENCOUNTER — Encounter (HOSPITAL_BASED_OUTPATIENT_CLINIC_OR_DEPARTMENT_OTHER)
Admission: RE | Disposition: A | Discharge status: Home / Self Care | Payer: Self-pay | Source: Ambulatory Visit | Attending: Surgery

## 2014-07-07 DIAGNOSIS — Z803 Family history of malignant neoplasm of breast: Secondary | ICD-10-CM | POA: Insufficient documentation

## 2014-07-07 DIAGNOSIS — Z79899 Other long term (current) drug therapy: Secondary | ICD-10-CM | POA: Insufficient documentation

## 2014-07-07 DIAGNOSIS — K219 Gastro-esophageal reflux disease without esophagitis: Secondary | ICD-10-CM | POA: Insufficient documentation

## 2014-07-07 DIAGNOSIS — D4862 Neoplasm of uncertain behavior of left breast: Secondary | ICD-10-CM | POA: Diagnosis not present

## 2014-07-07 DIAGNOSIS — Z87891 Personal history of nicotine dependence: Secondary | ICD-10-CM | POA: Insufficient documentation

## 2014-07-07 DIAGNOSIS — N6320 Unspecified lump in the left breast, unspecified quadrant: Secondary | ICD-10-CM

## 2014-07-07 DIAGNOSIS — E669 Obesity, unspecified: Secondary | ICD-10-CM | POA: Insufficient documentation

## 2014-07-07 DIAGNOSIS — F419 Anxiety disorder, unspecified: Secondary | ICD-10-CM | POA: Insufficient documentation

## 2014-07-07 HISTORY — DX: Presence of spectacles and contact lenses: Z97.3

## 2014-07-07 HISTORY — DX: Gastro-esophageal reflux disease without esophagitis: K21.9

## 2014-07-07 HISTORY — PX: BREAST LUMPECTOMY WITH RADIOACTIVE SEED LOCALIZATION: SHX6424

## 2014-07-07 SURGERY — BREAST LUMPECTOMY WITH RADIOACTIVE SEED LOCALIZATION
Anesthesia: General | Laterality: Left

## 2014-07-07 MED ORDER — BUPIVACAINE-EPINEPHRINE (PF) 0.25% -1:200000 IJ SOLN
INTRAMUSCULAR | Status: AC
  Filled 2014-07-07: qty 30

## 2014-07-07 MED ORDER — CEFAZOLIN SODIUM-DEXTROSE 2-3 GM-% IV SOLR
2.0000 g | INTRAVENOUS | Status: AC
  Administered 2014-07-07: 2 g via INTRAVENOUS

## 2014-07-07 MED ORDER — FENTANYL CITRATE 0.05 MG/ML IJ SOLN
INTRAMUSCULAR | Status: AC
  Filled 2014-07-07: qty 6

## 2014-07-07 MED ORDER — BUPIVACAINE-EPINEPHRINE (PF) 0.25% -1:200000 IJ SOLN
INTRAMUSCULAR | Status: DC | PRN
  Administered 2014-07-07: 20 mL via PERINEURAL

## 2014-07-07 MED ORDER — SCOPOLAMINE 1 MG/3DAYS TD PT72: 1.0000 | MEDICATED_PATCH | TRANSDERMAL | Status: DC

## 2014-07-07 MED ORDER — FENTANYL CITRATE 0.05 MG/ML IJ SOLN: 50.0000 ug | INTRAMUSCULAR | Status: DC | PRN

## 2014-07-07 MED ORDER — OXYCODONE HCL 5 MG PO TABS
5.0000 mg | ORAL_TABLET | Freq: Once | ORAL | Status: AC | PRN
  Administered 2014-07-07: 5 mg via ORAL

## 2014-07-07 MED ORDER — PROPOFOL 10 MG/ML IV BOLUS
INTRAVENOUS | Status: DC | PRN
  Administered 2014-07-07: 200 mg via INTRAVENOUS

## 2014-07-07 MED ORDER — MIDAZOLAM HCL 2 MG/2ML IJ SOLN: 1.0000 mg | INTRAMUSCULAR | Status: DC | PRN

## 2014-07-07 MED ORDER — PROPOFOL 10 MG/ML IV BOLUS
INTRAVENOUS | Status: AC
  Filled 2014-07-07: qty 20

## 2014-07-07 MED ORDER — PROPOFOL 10 MG/ML IV EMUL
INTRAVENOUS | Status: AC
  Filled 2014-07-07: qty 50

## 2014-07-07 MED ORDER — LIDOCAINE HCL (CARDIAC) 20 MG/ML IV SOLN
INTRAVENOUS | Status: DC | PRN
  Administered 2014-07-07: 60 mg via INTRAVENOUS

## 2014-07-07 MED ORDER — ONDANSETRON HCL 4 MG/2ML IJ SOLN
INTRAMUSCULAR | Status: DC | PRN
  Administered 2014-07-07: 4 mg via INTRAVENOUS

## 2014-07-07 MED ORDER — FENTANYL CITRATE 0.05 MG/ML IJ SOLN
INTRAMUSCULAR | Status: DC | PRN
  Administered 2014-07-07 (×2): 50 ug via INTRAVENOUS

## 2014-07-07 MED ORDER — LACTATED RINGERS IV SOLN
INTRAVENOUS | Status: DC
  Administered 2014-07-07 (×2): via INTRAVENOUS

## 2014-07-07 MED ORDER — SCOPOLAMINE 1 MG/3DAYS TD PT72
MEDICATED_PATCH | TRANSDERMAL | Status: AC
  Filled 2014-07-07: qty 1

## 2014-07-07 MED ORDER — CEFAZOLIN SODIUM-DEXTROSE 2-3 GM-% IV SOLR
INTRAVENOUS | Status: AC
  Filled 2014-07-07: qty 50

## 2014-07-07 MED ORDER — OXYCODONE HCL 5 MG/5ML PO SOLN: 5.0000 mg | Freq: Once | ORAL | Status: AC | PRN

## 2014-07-07 MED ORDER — OXYCODONE-ACETAMINOPHEN 5-325 MG PO TABS: 1.0000 | ORAL_TABLET | ORAL | Status: DC | PRN

## 2014-07-07 MED ORDER — BUPIVACAINE HCL (PF) 0.25 % IJ SOLN
INTRAMUSCULAR | Status: AC
  Filled 2014-07-07: qty 60

## 2014-07-07 MED ORDER — EPHEDRINE SULFATE 50 MG/ML IJ SOLN
INTRAMUSCULAR | Status: DC | PRN
  Administered 2014-07-07 (×2): 10 mg via INTRAVENOUS

## 2014-07-07 MED ORDER — OXYCODONE HCL 5 MG PO TABS
ORAL_TABLET | ORAL | Status: AC
  Filled 2014-07-07: qty 1

## 2014-07-07 MED ORDER — MIDAZOLAM HCL 2 MG/2ML IJ SOLN
INTRAMUSCULAR | Status: AC
  Filled 2014-07-07: qty 2

## 2014-07-07 MED ORDER — DEXAMETHASONE SODIUM PHOSPHATE 4 MG/ML IJ SOLN
INTRAMUSCULAR | Status: DC | PRN
  Administered 2014-07-07: 10 mg via INTRAVENOUS

## 2014-07-07 MED ORDER — ONDANSETRON HCL 4 MG/2ML IJ SOLN: 4.0000 mg | Freq: Once | INTRAMUSCULAR | Status: DC | PRN

## 2014-07-07 MED ORDER — HYDROMORPHONE HCL 1 MG/ML IJ SOLN: 0.2500 mg | INTRAMUSCULAR | Status: DC | PRN

## 2014-07-07 MED ORDER — SCOPOLAMINE 1 MG/3DAYS TD PT72
1.0000 | MEDICATED_PATCH | TRANSDERMAL | Status: DC
  Administered 2014-07-07: 1.5 mg via TRANSDERMAL

## 2014-07-07 MED ORDER — CHLORHEXIDINE GLUCONATE 4 % EX LIQD: 1.0000 "application " | Freq: Once | CUTANEOUS | Status: DC

## 2014-07-07 MED ORDER — MIDAZOLAM HCL 5 MG/5ML IJ SOLN
INTRAMUSCULAR | Status: DC | PRN
  Administered 2014-07-07: 2 mg via INTRAVENOUS

## 2014-07-07 SURGICAL SUPPLY — 53 items
APPLIER CLIP 9.375 MED OPEN (MISCELLANEOUS)
APR CLP MED 9.3 20 MLT OPN (MISCELLANEOUS)
BINDER BREAST LRG (GAUZE/BANDAGES/DRESSINGS) IMPLANT
BINDER BREAST MEDIUM (GAUZE/BANDAGES/DRESSINGS) IMPLANT
BINDER BREAST XLRG (GAUZE/BANDAGES/DRESSINGS) IMPLANT
BINDER BREAST XXLRG (GAUZE/BANDAGES/DRESSINGS) ×1 IMPLANT
BLADE SURG 15 STRL LF DISP TIS (BLADE) ×1 IMPLANT
BLADE SURG 15 STRL SS (BLADE) ×2
CANISTER SUC SOCK COL 7IN (MISCELLANEOUS) ×1 IMPLANT
CANISTER SUCT 1200ML W/VALVE (MISCELLANEOUS) IMPLANT
CHLORAPREP W/TINT 26ML (MISCELLANEOUS) ×2 IMPLANT
CLIP APPLIE 9.375 MED OPEN (MISCELLANEOUS) IMPLANT
CLIP TI WIDE RED SMALL 6 (CLIP) ×1 IMPLANT
COVER BACK TABLE 60X90IN (DRAPES) ×2 IMPLANT
COVER MAYO STAND STRL (DRAPES) ×2 IMPLANT
COVER PROBE W GEL 5X96 (DRAPES) ×2 IMPLANT
DECANTER SPIKE VIAL GLASS SM (MISCELLANEOUS) IMPLANT
DEVICE DUBIN W/COMP PLATE 8390 (MISCELLANEOUS) ×2 IMPLANT
DRAPE LAPAROSCOPIC ABDOMINAL (DRAPES) IMPLANT
DRAPE LAPAROTOMY 100X72 PEDS (DRAPES) ×2 IMPLANT
DRAPE UTILITY XL STRL (DRAPES) ×2 IMPLANT
ELECT COATED BLADE 2.86 ST (ELECTRODE) ×2 IMPLANT
ELECT REM PT RETURN 9FT ADLT (ELECTROSURGICAL) ×2
ELECTRODE REM PT RTRN 9FT ADLT (ELECTROSURGICAL) ×1 IMPLANT
GLOVE BIOGEL PI IND STRL 6.5 (GLOVE) IMPLANT
GLOVE BIOGEL PI IND STRL 7.5 (GLOVE) IMPLANT
GLOVE BIOGEL PI IND STRL 8 (GLOVE) ×1 IMPLANT
GLOVE BIOGEL PI INDICATOR 6.5 (GLOVE) ×1
GLOVE BIOGEL PI INDICATOR 7.5 (GLOVE) ×1
GLOVE BIOGEL PI INDICATOR 8 (GLOVE) ×1
GLOVE ECLIPSE 6.5 STRL STRAW (GLOVE) ×1 IMPLANT
GLOVE ECLIPSE 8.0 STRL XLNG CF (GLOVE) ×2 IMPLANT
GLOVE SURG SS PI 7.0 STRL IVOR (GLOVE) ×1 IMPLANT
GOWN STRL REUS W/ TWL LRG LVL3 (GOWN DISPOSABLE) ×2 IMPLANT
GOWN STRL REUS W/TWL LRG LVL3 (GOWN DISPOSABLE) ×6
HEMOSTAT SNOW SURGICEL 2X4 (HEMOSTASIS) IMPLANT
KIT MARKER MARGIN INK (KITS) ×2 IMPLANT
LIQUID BAND (GAUZE/BANDAGES/DRESSINGS) ×2 IMPLANT
NDL HYPO 25X1 1.5 SAFETY (NEEDLE) ×1 IMPLANT
NEEDLE HYPO 25X1 1.5 SAFETY (NEEDLE) ×2 IMPLANT
NS IRRIG 1000ML POUR BTL (IV SOLUTION) ×2 IMPLANT
PACK BASIN DAY SURGERY FS (CUSTOM PROCEDURE TRAY) ×2 IMPLANT
PENCIL BUTTON HOLSTER BLD 10FT (ELECTRODE) ×2 IMPLANT
SLEEVE SCD COMPRESS KNEE MED (MISCELLANEOUS) ×2 IMPLANT
SPONGE LAP 4X18 X RAY DECT (DISPOSABLE) ×2 IMPLANT
SUT MNCRL AB 4-0 PS2 18 (SUTURE) ×2 IMPLANT
SUT SILK 2 0 SH (SUTURE) IMPLANT
SUT VICRYL 3-0 CR8 SH (SUTURE) ×2 IMPLANT
SYR CONTROL 10ML LL (SYRINGE) ×2 IMPLANT
TOWEL OR 17X24 6PK STRL BLUE (TOWEL DISPOSABLE) ×2 IMPLANT
TOWEL OR NON WOVEN STRL DISP B (DISPOSABLE) ×1 IMPLANT
TUBE CONNECTING 20X1/4 (TUBING) IMPLANT
YANKAUER SUCT BULB TIP NO VENT (SUCTIONS) IMPLANT

## 2014-07-07 NOTE — Anesthesia Procedure Notes (Signed)
Procedure Name: LMA Insertion Date/Time: 07/07/2014 7:34 AM Performed by: Amerie Beaumont D Pre-anesthesia Checklist: Patient identified, Emergency Drugs available, Suction available and Patient being monitored Patient Re-evaluated:Patient Re-evaluated prior to inductionOxygen Delivery Method: Circle System Utilized Preoxygenation: Pre-oxygenation with 100% oxygen Intubation Type: IV induction Ventilation: Mask ventilation without difficulty LMA: LMA inserted LMA Size: 4.0 Number of attempts: 1 Airway Equipment and Method: Bite block Placement Confirmation: positive ETCO2 Tube secured with: Tape Dental Injury: Teeth and Oropharynx as per pre-operative assessment

## 2014-07-07 NOTE — Interval H&P Note (Signed)
History and Physical Interval Note:  07/07/2014 7:23 AM  Amanda Kelly  has presented today for surgery, with the diagnosis of Left Breast Mass  The various methods of treatment have been discussed with the patient and family. After consideration of risks, benefits and other options for treatment, the patient has consented to  Procedure(s): LEFT BREAST LUMPECTOMY WITH RADIOACTIVE SEED LOCALIZATION (Left) as a surgical intervention .  The patient's history has been reviewed, patient examined, no change in status, stable for surgery.  I have reviewed the patient's chart and labs.  Questions were answered to the patient's satisfaction.     [name].

## 2014-07-07 NOTE — H&P (View-Only) (Signed)
[facility] 06/29/2014 9:46 AM Location: [facility] Patient #: [id number] DOB: 24-Jul-1960 Married / Language: English / Race: White Female History of Present Illness [name] A. Leroy Pettway MD; 06/29/2014 4:22 PM) Patient words: breast cancer       Pt present for left breast mass core bx showing biphasic lesion.    Pt had this discovered on mammogram. No symptoms.       CLINICAL DATA: Patient notes a palpable lump within the left breast for 2 months. The patient has a family history breast cancer in a grandmother at age 17 and 3 maternal cousins (premenopausal). She has undergone a previous left breast ultrasound-guided core biopsy in December, 2013 which demonstrated extensive stromal fibrosis. She has also undergone a previous benign left breast surgical excisional biopsy within the upper-outer quadrant of the left breast.  EXAM: DIGITAL DIAGNOSTIC bilateral MAMMOGRAM WITH 3D TOMOSYNTHESIS WITH CAD  ULTRASOUND left BREAST  COMPARISON: 03/26/2012, 03/14/2012, 03/01/2012, 05/12/2009, 05/11/2008.  ACR Breast Density Category c: The breast tissue is heterogeneously dense, which may obscure small masses.  FINDINGS: Two groups of calcifications are seen within the right breast 1 group is located inferiorly within the right breast at approximately the 6:30 o'clock position and spans 10 mm. The majority of calcifications within this group are punctate. There are several amorphous calcifications associated with this group. There is a second group of calcifications located within the upper inner quadrant of the right breast which span 11 mm. These vary in size and shape and are suspicious for possible DCIS. Tissue sampling via stereotactic core biopsy is recommended. This has been scheduled for 06/03/2014. Depending on the results of this biopsy, stereotactic biopsy of the second group of calcifications located at the 6:30 o'clock position may be  needed.  There is an oval, macrolobulated mass located within the upper inner quadrant of the left breast with a clip associated with the anterior portion of this mass. This mass has increased in size mammographically. There is distortion located within the upper outer quadrant left breast related to the patient's previous benign surgical excisional biopsy.  Mammographic images were processed with CAD.  On physical exam, there is a mobile, palpable mass located within the left breast at the 9:30 o'clock position 5 cm from the nipple. There is no palpable left axillary adenopathy.  Targeted ultrasound is performed, showing a heterogeneous mass with slightly irregular margins located within the left breast at the 9:30 o'clock position 5 cm from the nipple. This measures 2.1 x 1.4 x 1.6 cm in size. This corresponds to the palpable mass. Tissue sampling via ultrasound-guided core biopsy is recommended. This is scheduled for 06/03/2014.  Ultrasound of the left axilla demonstrates normal axillary contents and no evidence for adenopathy.  IMPRESSION: 1. 2.1 cm mass located within the upper inner quadrant left breast 9:30 o'clock position 5 cm from the nipple. Tissue sampling via ultrasound-guided core biopsy is recommended and is scheduled for 06/03/2014.  2. Two groups of calcifications located within the right breast as discussed above. Stereotactic core biopsy of the grouped calcifications located within the upper inner quadrant of the right breast is suggested and is scheduled for 06/03/2014. Depending on the results of this biopsy stereotactic core biopsy of the second group calcifications may be needed.  RECOMMENDATION: Left breast ultrasound-guided core biopsy and right breast stereotactic core biopsy.  I have discussed the findings and recommendations with the patient. Results were also provided in writing at the conclusion of the visit. If applicable, a reminder  letter  will be sent to the patient regarding the next appointment.  BI-RADS CATEGORY 4: Suspicious.   Electronically Signed By: Altamese Cabal M.D. On: 05/25/2014 12:50  CLINICAL DATA: 54 year old female status post ultrasound-guided biopsy of the left breast and stereotactic guided biopsy of the right breast  EXAM: DIAGNOSTIC BILATERAL MAMMOGRAM POST ULTRASOUND BIOPSY OF THE LEFT BREAST AND STEREOTACTIC BIOPSY OF THE RIGHT BREAST  COMPARISON: Previous exam(s).  FINDINGS: Mammographic images were obtained following ultrasound guided biopsy of the left breast and stereotactic guided biopsy of the right breast. Post biopsy mammogram confirms the X shaped clip to be within the expected position within the upper, inner right breast and the heart shaped clip to be within the expected position within the upper, inner left breast.  IMPRESSION: Satisfactory clip placement within both breasts as above.  Final Assessment: Post Procedure Mammograms for Marker Placement     Breast, left, needle core biopsy, 9:30 o'clock, 5 CMFN - BIPHASIC (STROMAL/EPITHELIAL) LESION. - SEE COMMENT.  The patient is a 54 year old female   Other Problems Elbert Ewings, CMA; 06/29/2014 9:46 AM) Anxiety Disorder Gastroesophageal Reflux Disease Lump In Breast  Past Surgical History Elbert Ewings, CMA; 06/29/2014 9:46 AM) Breast Biopsy multiple  Diagnostic Studies History Elbert Ewings, CMA; 06/29/2014 9:46 AM) Colonoscopy within last year Mammogram within last year Pap Smear 1-5 years ago  Allergies Elbert Ewings, CMA; 06/29/2014 9:47 AM) No Known Drug Allergies 06/29/2014  Medication History Elbert Ewings, CMA; 06/29/2014 9:48 AM) Atorvastatin Calcium (40MG  Tablet, Oral) Active. DULoxetine HCl (60MG  Capsule DR Part, Oral) Active. Metoprolol Succinate ER (50MG  Tablet ER 24HR, Oral) Active. Omeprazole (20MG  Capsule DR, Oral) Active. Diflucan (150MG  Tablet, Oral) Active. Medications  Reconciled Hydrocodone-Acetaminophen (2.5-500MG  Tablet, Oral) Active.  Social History Elbert Ewings, [place]; 06/29/2014 9:46 AM) Alcohol use Occasional alcohol use. Caffeine use Coffee. No drug use Tobacco use Former smoker.  Family History Elbert Ewings, [place]; 06/29/2014 9:46 AM) Breast Cancer Family Members In General.  Pregnancy / Birth History Elbert Ewings, CMA; 06/29/2014 9:46 AM) Age at menarche 21 years. Age of menopause 42-50 Gravida 0 Irregular periods Para 0     Review of Systems Elbert Ewings CMA; 06/29/2014 9:46 AM) General Present- Fatigue and Night Sweats. Not Present- Appetite Loss, Chills, Fever, Weight Gain and Weight Loss. Skin Not Present- Change in Wart/Mole, Dryness, Hives, Jaundice, New Lesions, Non-Healing Wounds, Rash and Ulcer. HEENT Present- Wears glasses/contact lenses. Not Present- Earache, Hearing Loss, Hoarseness, Nose Bleed, Oral Ulcers, Ringing in the Ears, Seasonal Allergies, Sinus Pain, Sore Throat, Visual Disturbances and Yellow Eyes. Respiratory Not Present- Bloody sputum, Chronic Cough, Difficulty Breathing, Snoring and Wheezing. Breast Present- Breast Mass. Not Present- Breast Pain, Nipple Discharge and Skin Changes. Cardiovascular Present- Rapid Heart Rate. Not Present- Chest Pain, Difficulty Breathing Lying Down, Leg Cramps, Palpitations, Shortness of Breath and Swelling of Extremities. Gastrointestinal Not Present- Abdominal Pain, Bloating, Bloody Stool, Change in Bowel Habits, Chronic diarrhea, Constipation, Difficulty Swallowing, Excessive gas, Gets full quickly at meals, Hemorrhoids, Indigestion, Nausea, Rectal Pain and Vomiting. Female Genitourinary Not Present- Frequency, Nocturia, Painful Urination, Pelvic Pain and Urgency. Musculoskeletal Not Present- Back Pain, Joint Pain, Joint Stiffness, Muscle Pain, Muscle Weakness and Swelling of Extremities. Neurological Present- Numbness and Tingling. Not Present- Decreased Memory, Fainting,  Headaches, Seizures, Tremor, Trouble walking and Weakness. Psychiatric Present- Anxiety. Not Present- Bipolar, Change in Sleep Pattern, Depression, Fearful and Frequent crying. Endocrine Present- Hot flashes. Not Present- Cold Intolerance, Excessive Hunger, Hair Changes, Heat Intolerance and New Diabetes.  Vitals Elbert Ewings  CMA; 06/29/2014 9:49 AM) 06/29/2014 9:48 AM Weight: 247 lb Height: 65in Body Surface Area: 2.27 m Body Mass Index: 41.1 kg/m Temp.: 97.24F(Temporal)  Pulse: 102 (Regular)  Resp.: 18 (Unlabored)  BP: 150/82 (Sitting, Left Arm, Standard)     Physical Exam (Rafiq Bucklin A. Sora Vrooman MD; 06/29/2014 4:22 PM)  General Mental Status-Alert. General Appearance-Consistent with stated age. Hydration-Well hydrated. Voice-Normal.  Head and Neck Head-normocephalic, atraumatic with no lesions or palpable masses. Trachea-midline. Thyroid Gland Characteristics - normal size and consistency.  Eye Eyeball - Bilateral-Extraocular movements intact. Sclera/Conjunctiva - Bilateral-No scleral icterus.  Chest and Lung Exam Chest and lung exam reveals -quiet, even and easy respiratory effort with no use of accessory muscles and on auscultation, normal breath sounds, no adventitious sounds and normal vocal resonance. Inspection Chest Wall - Normal. Back - normal.  Breast Breast - Left-Symmetric, Non Tender, No Biopsy scars, no Dimpling, No Inflammation, No Lumpectomy scars, No Mastectomy scars, No Peau d' Orange. Breast - Right-Symmetric, Non Tender, No Biopsy scars, no Dimpling, No Inflammation, No Lumpectomy scars, No Mastectomy scars, No Peau d' Orange. Breast Lump-No Palpable Breast Mass.  Cardiovascular Cardiovascular examination reveals -normal heart sounds, regular rate and rhythm with no murmurs and normal pedal pulses bilaterally.  Abdomen Inspection Inspection of the abdomen reveals - No Hernias. Skin - Scar - no surgical  scars. Palpation/Percussion Palpation and Percussion of the abdomen reveal - Soft, Non Tender, No Rebound tenderness, No Rigidity (guarding) and No hepatosplenomegaly. Auscultation Auscultation of the abdomen reveals - Bowel sounds normal.  Neurologic Neurologic evaluation reveals -alert and oriented x 3 with no impairment of recent or remote memory. Mental Status-Normal.  Musculoskeletal Normal Exam - Left-Upper Extremity Strength Normal and Lower Extremity Strength Normal. Normal Exam - Right-Upper Extremity Strength Normal and Lower Extremity Strength Normal.  Lymphatic Head & Neck  General Head & Neck Lymphatics: Bilateral - Description - Normal. Axillary  General Axillary Region: Bilateral - Description - Normal. Tenderness - Non Tender. Femoral & Inguinal  Generalized Femoral & Inguinal Lymphatics: Bilateral - Description - Normal. Tenderness - Non Tender.    Assessment & Plan (Ranessa Kosta A. Chael Urenda MD; 06/29/2014 4:22 PM)  LEFT BREAST MASS (611.72  N63) Impression: biphasic lesion 2.1 cm. recommend excision to exclude phylloides tumor. Risk of lumpectomy include bleeding, infection, seroma, more surgery, use of seed/wire, wound care, cosmetic deformity and the need for other treatments, death , blood clots, death. Pt agrees to proceed.  Current Plans Pt Education - [facility] Breast Biopsy [facility]

## 2014-07-07 NOTE — Anesthesia Preprocedure Evaluation (Addendum)
Anesthesia Evaluation  Patient identified by MRN, date of birth, ID band Patient awake    Reviewed: Allergy & Precautions, NPO status , Patient's Chart, lab work & pertinent test results, reviewed documented beta blocker date and time   Airway Mallampati: II  TM Distance: >3 FB Neck ROM: Full    Dental  (+) Teeth Intact, Dental Advisory Given   Pulmonary former smoker,  breath sounds clear to auscultation        Cardiovascular hypertension, Pt. on home beta blockers Rhythm:Regular Rate:Normal     Neuro/Psych PSYCHIATRIC DISORDERS Anxiety Depression    GI/Hepatic GERD-  Medicated,  Endo/Other  Morbid obesity  Renal/GU      Musculoskeletal   Abdominal   Peds  Hematology   Anesthesia Other Findings   Reproductive/Obstetrics                            Anesthesia Physical Anesthesia Plan  ASA: II  Anesthesia Plan: General   Post-op Pain Management:    Induction: Intravenous  Airway Management Planned: LMA  Additional Equipment:   Intra-op Plan:   Post-operative Plan: Extubation in OR  Informed Consent: I have reviewed the patients History and Physical, chart, labs and discussed the procedure including the risks, benefits and alternatives for the proposed anesthesia with the patient or authorized representative who has indicated his/her understanding and acceptance.   Dental advisory given  Plan Discussed with: CRNA, Anesthesiologist and Surgeon  Anesthesia Plan Comments:         Anesthesia Quick Evaluation

## 2014-07-07 NOTE — Anesthesia Postprocedure Evaluation (Signed)
  Anesthesia Post-op Note  Patient: Amanda Kelly  Procedure(s) Performed: Procedure(s): LEFT BREAST LUMPECTOMY WITH RADIOACTIVE SEED LOCALIZATION (Left)  Patient Location: PACU  Anesthesia Type: General   Level of Consciousness: awake, alert  and oriented  Airway and Oxygen Therapy: Patient Spontanous Breathing  Post-op Pain: none  Post-op Assessment: Post-op Vital signs reviewed  Post-op Vital Signs: Reviewed  Last Vitals:  Filed Vitals:   07/07/14 0855  BP: 128/61  Pulse: 88  Temp:   Resp: 15    Complications: No apparent anesthesia complications

## 2014-07-07 NOTE — Op Note (Signed)
Preoperative diagnosis: Left breast mass  Postoperative diagnosis: Same   Procedure: Left breast seed localized lumpectomy  Surgeon: Erroll Luna M.D.  Anesthesia: Gen. With 0.25% Sensorcaine local with epinephrine  EBL: 20 cc  Specimen: Left breast tissue with clip and radioactive seed in the specimen. Verified with neoprobe and radiographic image showing both seed and clip in specimen and additional superior margin.  Indications for procedure: The patient presents for left breast excisional lumpectomy after core biopsy showed biphasic lesion. . Discussed the rationale for considering excision. Small risk of malignancy associated with biphasic  lesion after core biopsy. Discussed observation. Discussed wire localization and seed localization. Patient desired excision of left breast mass. .The procedure has been discussed with the patient. Alternatives to surgery have been discussed with the patient.  Risks of surgery include bleeding,  Infection, cosmetic change to breast and  Seroma formation, death,  and the need for further surgery.   The patient understands and wishes to proceed.   Description of procedure: Patient underwent seed placement as an outpatient. Patient presents today for left breast seed localized lumpectomy. Patient and holding area. Questions are answered and neoprobe used to verify seed location. Patient taken back to the operating room and placed upon the OR table. After induction of general anesthesia, left breast prepped and draped in a sterile fashion. Timeout was done to verify proper s procedure. Neoprobe used and hot spot identified and left breast upper inner  quadrant. This was marked with pen. Linear  incision made left upper inner  quadrant breast. Dissection used with the help of a neoprobe around the tissue where the seed and clip were located. Tissue removed in its entirety with gross negative  margins.. Neoprobe used and seed and clips  within specimen.  Radiographs taken which show clips and seed  In specimen. Additional superior margin taken.  Hemostasis achieved and cavity closed with 3-0 Vicryl and 4-0 Monocryl. Dermabond applied. All final counts found to be correct. Specimen transported to pathology. Patient awoke extubated taken to recovery in satisfactory condition.

## 2014-07-07 NOTE — Transfer of Care (Signed)
Immediate Anesthesia Transfer of Care Note  Patient: DELCIA SPITZLEY  Procedure(s) Performed: Procedure(s): LEFT BREAST LUMPECTOMY WITH RADIOACTIVE SEED LOCALIZATION (Left)  Patient Location: PACU  Anesthesia Type:General  Level of Consciousness: awake, alert , oriented and patient cooperative  Airway & Oxygen Therapy: Patient Spontanous Breathing and Patient connected to face mask oxygen  Post-op Assessment: Report given to RN and Post -op Vital signs reviewed and stable  Post vital signs: Reviewed and stable  Last Vitals:  Filed Vitals:   07/07/14 0619  BP: 141/90  Pulse: 88  Temp: 36.9 C  Resp: 18    Complications: No apparent anesthesia complications

## 2014-07-07 NOTE — Discharge Instructions (Signed)
[facility] °Office Phone Number [phone] ° °BREAST BIOPSY/ PARTIAL MASTECTOMY: POST OP INSTRUCTIONS ° °Always review your discharge instruction sheet given to you by the facility where your surgery was performed. ° °IF YOU HAVE DISABILITY OR FAMILY LEAVE FORMS, YOU MUST BRING THEM TO THE OFFICE FOR PROCESSING.  DO NOT GIVE THEM TO YOUR DOCTOR. ° °1. A prescription for pain medication may be given to you upon discharge.  Take your pain medication as prescribed, if needed.  If narcotic pain medicine is not needed, then you may take acetaminophen (Tylenol) or ibuprofen (Advil) as needed. °2. Take your usually prescribed medications unless otherwise directed °3. If you need a refill on your pain medication, please contact your pharmacy.  They will contact our office to request authorization.  Prescriptions will not be filled after 5pm or on week-ends. °4. You should eat very light the first 24 hours after surgery, such as soup, crackers, pudding, etc.  Resume your normal diet the day after surgery. °5. Most patients will experience some swelling and bruising in the breast.  Ice packs and a good support bra will help.  Swelling and bruising can take several days to resolve.  °6. It is common to experience some constipation if taking pain medication after surgery.  Increasing fluid intake and taking a stool softener will usually help or prevent this problem from occurring.  A mild laxative (Milk of Magnesia or Miralax) should be taken according to package directions if there are no bowel movements after 48 hours. °7. Unless discharge instructions indicate otherwise, you may remove your bandages 24-48 hours after surgery, and you may shower at that time.  You may have steri-strips (small skin tapes) in place directly over the incision.  These strips should be left on the skin for 7-10 days.  If your surgeon used skin glue on the incision, you may shower in 24 hours.  The glue will flake off over the  next 2-3 weeks.  Any sutures or staples will be removed at the office during your follow-up visit. °8. ACTIVITIES:  You may resume regular daily activities (gradually increasing) beginning the next day.  Wearing a good support bra or sports bra minimizes pain and swelling.  You may have sexual intercourse when it is comfortable. °a. You may drive when you no longer are taking prescription pain medication, you can comfortably wear a seatbelt, and you can safely maneuver your car and apply brakes. °b. RETURN TO WORK:  ______________________________________________________________________________________ °9. You should see your doctor in the office for a follow-up appointment approximately two weeks after your surgery.  Your doctor’s nurse will typically make your follow-up appointment when she calls you with your pathology report.  Expect your pathology report 2-3 business days after your surgery.  You may call to check if you do not hear from us after three days. °10. OTHER INSTRUCTIONS: _______________________________________________________________________________________________ _____________________________________________________________________________________________________________________________________ °_____________________________________________________________________________________________________________________________________ °_____________________________________________________________________________________________________________________________________ ° °WHEN TO CALL YOUR DOCTOR: °1. Fever over 101.0 °2. Nausea and/or vomiting. °3. Extreme swelling or bruising. °4. Continued bleeding from incision. °5. Increased pain, redness, or drainage from the incision. ° °The clinic staff is available to answer your questions during regular business hours.  Please don’t hesitate to call and ask to speak to one of the nurses for clinical concerns.  If you have a medical emergency, go to the nearest  emergency room or call 911.  A surgeon from [facility] is always on call at the hospital. ° °For further questions, please visit [email or web address]  ° ° ° °  Post Anesthesia Home Care Instructions ° °Activity: °Get plenty of rest for the remainder of the day. A responsible adult should stay with you for 24 hours following the procedure.  °For the next 24 hours, DO NOT: °-Drive a car °-Operate machinery °-Drink alcoholic beverages °-Take any medication unless instructed by your physician °-Make any legal decisions or sign important papers. ° °Meals: °Start with liquid foods such as gelatin or soup. Progress to regular foods as tolerated. Avoid greasy, spicy, heavy foods. If nausea and/or vomiting occur, drink only clear liquids until the nausea and/or vomiting subsides. Call your physician if vomiting continues. ° °Special Instructions/Symptoms: °Your throat may feel dry or sore from the anesthesia or the breathing tube placed in your throat during surgery. If this causes discomfort, gargle with warm salt water. The discomfort should disappear within 24 hours. ° °If you had a scopolamine patch placed behind your ear for the management of post- operative nausea and/or vomiting: ° °1. The medication in the patch is effective for 72 hours, after which it should be removed.  Wrap patch in a tissue and discard in the trash. Wash hands thoroughly with soap and water. °2. You may remove the patch earlier than 72 hours if you experience unpleasant side effects which may include dry mouth, dizziness or visual disturbances. °3. Avoid touching the patch. Wash your hands with soap and water after contact with the patch. °  ° °

## 2014-07-08 ENCOUNTER — Encounter (HOSPITAL_BASED_OUTPATIENT_CLINIC_OR_DEPARTMENT_OTHER): Payer: Self-pay | Admitting: Surgery

## 2014-07-10 ENCOUNTER — Ambulatory Visit: Payer: Self-pay | Admitting: Surgery

## 2014-07-20 ENCOUNTER — Encounter (HOSPITAL_BASED_OUTPATIENT_CLINIC_OR_DEPARTMENT_OTHER): Payer: Self-pay | Admitting: *Deleted

## 2014-07-20 NOTE — Progress Notes (Signed)
No new labs needed 

## 2014-07-22 ENCOUNTER — Ambulatory Visit (HOSPITAL_BASED_OUTPATIENT_CLINIC_OR_DEPARTMENT_OTHER): Payer: BLUE CROSS/BLUE SHIELD | Admitting: Anesthesiology

## 2014-07-22 ENCOUNTER — Ambulatory Visit (HOSPITAL_BASED_OUTPATIENT_CLINIC_OR_DEPARTMENT_OTHER)
Admission: RE | Admit: 2014-07-22 | Discharge: 2014-07-22 | Disposition: A | Discharge status: Home / Self Care | Payer: BLUE CROSS/BLUE SHIELD | Source: Ambulatory Visit | Attending: Surgery | Admitting: Surgery

## 2014-07-22 ENCOUNTER — Encounter (HOSPITAL_BASED_OUTPATIENT_CLINIC_OR_DEPARTMENT_OTHER)
Admission: RE | Disposition: A | Discharge status: Home / Self Care | Payer: Self-pay | Source: Ambulatory Visit | Attending: Surgery

## 2014-07-22 ENCOUNTER — Encounter (HOSPITAL_BASED_OUTPATIENT_CLINIC_OR_DEPARTMENT_OTHER): Payer: Self-pay | Admitting: *Deleted

## 2014-07-22 DIAGNOSIS — N641 Fat necrosis of breast: Secondary | ICD-10-CM | POA: Diagnosis not present

## 2014-07-22 DIAGNOSIS — K219 Gastro-esophageal reflux disease without esophagitis: Secondary | ICD-10-CM | POA: Diagnosis not present

## 2014-07-22 DIAGNOSIS — N6012 Diffuse cystic mastopathy of left breast: Secondary | ICD-10-CM | POA: Insufficient documentation

## 2014-07-22 DIAGNOSIS — N6092 Unspecified benign mammary dysplasia of left breast: Secondary | ICD-10-CM | POA: Diagnosis not present

## 2014-07-22 DIAGNOSIS — Z803 Family history of malignant neoplasm of breast: Secondary | ICD-10-CM | POA: Diagnosis not present

## 2014-07-22 DIAGNOSIS — F419 Anxiety disorder, unspecified: Secondary | ICD-10-CM | POA: Diagnosis not present

## 2014-07-22 DIAGNOSIS — D4862 Neoplasm of uncertain behavior of left breast: Secondary | ICD-10-CM | POA: Diagnosis present

## 2014-07-22 DIAGNOSIS — I10 Essential (primary) hypertension: Secondary | ICD-10-CM | POA: Insufficient documentation

## 2014-07-22 DIAGNOSIS — Z79899 Other long term (current) drug therapy: Secondary | ICD-10-CM | POA: Diagnosis not present

## 2014-07-22 DIAGNOSIS — Z79891 Long term (current) use of opiate analgesic: Secondary | ICD-10-CM | POA: Insufficient documentation

## 2014-07-22 DIAGNOSIS — Z87891 Personal history of nicotine dependence: Secondary | ICD-10-CM | POA: Diagnosis not present

## 2014-07-22 HISTORY — PX: RE-EXCISION OF BREAST LUMPECTOMY: SHX6048

## 2014-07-22 SURGERY — EXCISION, LESION, BREAST
Anesthesia: General | Site: Breast | Laterality: Left

## 2014-07-22 MED ORDER — LIDOCAINE HCL (CARDIAC) 20 MG/ML IV SOLN
INTRAVENOUS | Status: DC | PRN
  Administered 2014-07-22: 100 mg via INTRAVENOUS

## 2014-07-22 MED ORDER — HYDROCODONE-ACETAMINOPHEN 5-325 MG PO TABS: 1.0000 | ORAL_TABLET | Freq: Four times a day (QID) | ORAL | Status: DC | PRN

## 2014-07-22 MED ORDER — BUPIVACAINE-EPINEPHRINE (PF) 0.25% -1:200000 IJ SOLN
INTRAMUSCULAR | Status: AC
  Filled 2014-07-22: qty 30

## 2014-07-22 MED ORDER — ONDANSETRON HCL 4 MG/2ML IJ SOLN
INTRAMUSCULAR | Status: DC | PRN
  Administered 2014-07-22: 4 mg via INTRAVENOUS

## 2014-07-22 MED ORDER — SCOPOLAMINE 1 MG/3DAYS TD PT72
1.0000 | MEDICATED_PATCH | TRANSDERMAL | Status: DC
  Administered 2014-07-22: 1.5 mg via TRANSDERMAL

## 2014-07-22 MED ORDER — HYDROMORPHONE HCL 1 MG/ML IJ SOLN
0.2500 mg | INTRAMUSCULAR | Status: DC | PRN
  Administered 2014-07-22: 0.25 mg via INTRAVENOUS

## 2014-07-22 MED ORDER — FENTANYL CITRATE (PF) 100 MCG/2ML IJ SOLN
INTRAMUSCULAR | Status: AC
  Filled 2014-07-22: qty 4

## 2014-07-22 MED ORDER — GLYCOPYRROLATE 0.2 MG/ML IJ SOLN: 0.2000 mg | Freq: Once | INTRAMUSCULAR | Status: DC | PRN

## 2014-07-22 MED ORDER — HYDROMORPHONE HCL 1 MG/ML IJ SOLN
INTRAMUSCULAR | Status: AC
  Filled 2014-07-22: qty 1

## 2014-07-22 MED ORDER — BUPIVACAINE-EPINEPHRINE 0.25% -1:200000 IJ SOLN
INTRAMUSCULAR | Status: DC | PRN
  Administered 2014-07-22: 20 mL

## 2014-07-22 MED ORDER — LACTATED RINGERS IV SOLN
INTRAVENOUS | Status: DC
  Administered 2014-07-22: 15:00:00 via INTRAVENOUS

## 2014-07-22 MED ORDER — CEFAZOLIN SODIUM-DEXTROSE 2-3 GM-% IV SOLR: 2.0000 g | INTRAVENOUS | Status: DC

## 2014-07-22 MED ORDER — DEXAMETHASONE SODIUM PHOSPHATE 4 MG/ML IJ SOLN
INTRAMUSCULAR | Status: DC | PRN
  Administered 2014-07-22: 10 mg via INTRAVENOUS

## 2014-07-22 MED ORDER — PROMETHAZINE HCL 25 MG/ML IJ SOLN: 6.2500 mg | INTRAMUSCULAR | Status: DC | PRN

## 2014-07-22 MED ORDER — FENTANYL CITRATE (PF) 100 MCG/2ML IJ SOLN: 50.0000 ug | INTRAMUSCULAR | Status: DC | PRN

## 2014-07-22 MED ORDER — EPHEDRINE SULFATE 50 MG/ML IJ SOLN
INTRAMUSCULAR | Status: DC | PRN
  Administered 2014-07-22: 10 mg via INTRAVENOUS

## 2014-07-22 MED ORDER — MIDAZOLAM HCL 2 MG/2ML IJ SOLN
1.0000 mg | INTRAMUSCULAR | Status: DC | PRN
Start: 2014-07-22 — End: 2014-07-22
  Administered 2014-07-22: 2 mg via INTRAVENOUS

## 2014-07-22 MED ORDER — CHLORHEXIDINE GLUCONATE 4 % EX LIQD: 1.0000 "application " | Freq: Once | CUTANEOUS | Status: DC

## 2014-07-22 MED ORDER — PROPOFOL 10 MG/ML IV BOLUS
INTRAVENOUS | Status: AC
  Filled 2014-07-22: qty 20

## 2014-07-22 MED ORDER — PROPOFOL 10 MG/ML IV BOLUS
INTRAVENOUS | Status: DC | PRN
  Administered 2014-07-22: 200 mg via INTRAVENOUS

## 2014-07-22 MED ORDER — MIDAZOLAM HCL 2 MG/2ML IJ SOLN
INTRAMUSCULAR | Status: AC
  Filled 2014-07-22: qty 2

## 2014-07-22 MED ORDER — SCOPOLAMINE 1 MG/3DAYS TD PT72
MEDICATED_PATCH | TRANSDERMAL | Status: AC
  Filled 2014-07-22: qty 1

## 2014-07-22 MED ORDER — CEFAZOLIN SODIUM-DEXTROSE 2-3 GM-% IV SOLR
INTRAVENOUS | Status: AC
  Filled 2014-07-22: qty 50

## 2014-07-22 MED ORDER — FENTANYL CITRATE (PF) 100 MCG/2ML IJ SOLN
INTRAMUSCULAR | Status: DC | PRN
  Administered 2014-07-22: 100 ug via INTRAVENOUS

## 2014-07-22 SURGICAL SUPPLY — 50 items
APPLIER CLIP 9.375 MED OPEN (MISCELLANEOUS) ×2
APR CLP MED 9.3 20 MLT OPN (MISCELLANEOUS) ×1
BINDER BREAST LRG (GAUZE/BANDAGES/DRESSINGS) IMPLANT
BINDER BREAST MEDIUM (GAUZE/BANDAGES/DRESSINGS) IMPLANT
BINDER BREAST XLRG (GAUZE/BANDAGES/DRESSINGS) IMPLANT
BINDER BREAST XXLRG (GAUZE/BANDAGES/DRESSINGS) IMPLANT
BLADE SURG 15 STRL LF DISP TIS (BLADE) ×1 IMPLANT
BLADE SURG 15 STRL SS (BLADE) ×2
CANISTER SUCT 1200ML W/VALVE (MISCELLANEOUS) ×2 IMPLANT
CHLORAPREP W/TINT 26ML (MISCELLANEOUS) ×2 IMPLANT
CLIP APPLIE 9.375 MED OPEN (MISCELLANEOUS) ×1 IMPLANT
CLIP TI WIDE RED SMALL 6 (CLIP) IMPLANT
COVER BACK TABLE 60X90IN (DRAPES) ×2 IMPLANT
COVER MAYO STAND STRL (DRAPES) ×2 IMPLANT
DECANTER SPIKE VIAL GLASS SM (MISCELLANEOUS) ×2 IMPLANT
DEVICE DUBIN W/COMP PLATE 8390 (MISCELLANEOUS) IMPLANT
DRAPE LAPAROSCOPIC ABDOMINAL (DRAPES) IMPLANT
DRAPE LAPAROTOMY 100X72 PEDS (DRAPES) ×2 IMPLANT
DRAPE UTILITY XL STRL (DRAPES) ×2 IMPLANT
ELECT COATED BLADE 2.86 ST (ELECTRODE) ×2 IMPLANT
ELECT REM PT RETURN 9FT ADLT (ELECTROSURGICAL) ×2
ELECTRODE REM PT RTRN 9FT ADLT (ELECTROSURGICAL) ×1 IMPLANT
GLOVE BIOGEL M STRL SZ7.5 (GLOVE) ×1 IMPLANT
GLOVE BIOGEL PI IND STRL 7.0 (GLOVE) IMPLANT
GLOVE BIOGEL PI IND STRL 8 (GLOVE) ×1 IMPLANT
GLOVE BIOGEL PI INDICATOR 7.0 (GLOVE) ×1
GLOVE BIOGEL PI INDICATOR 8 (GLOVE) ×2
GLOVE ECLIPSE 6.5 STRL STRAW (GLOVE) ×1 IMPLANT
GLOVE ECLIPSE 8.0 STRL XLNG CF (GLOVE) ×2 IMPLANT
GOWN STRL REUS W/ TWL LRG LVL3 (GOWN DISPOSABLE) ×2 IMPLANT
GOWN STRL REUS W/TWL LRG LVL3 (GOWN DISPOSABLE) ×4
KIT MARKER MARGIN INK (KITS) ×1 IMPLANT
LIQUID BAND (GAUZE/BANDAGES/DRESSINGS) ×2 IMPLANT
NDL HYPO 25X1 1.5 SAFETY (NEEDLE) ×1 IMPLANT
NEEDLE HYPO 25X1 1.5 SAFETY (NEEDLE) ×2 IMPLANT
NS IRRIG 1000ML POUR BTL (IV SOLUTION) ×2 IMPLANT
PACK BASIN DAY SURGERY FS (CUSTOM PROCEDURE TRAY) ×2 IMPLANT
PENCIL BUTTON HOLSTER BLD 10FT (ELECTRODE) ×2 IMPLANT
SLEEVE SCD COMPRESS KNEE MED (MISCELLANEOUS) ×2 IMPLANT
SPONGE LAP 4X18 X RAY DECT (DISPOSABLE) ×2 IMPLANT
STAPLER VISISTAT 35W (STAPLE) IMPLANT
SUT MON AB 4-0 PC3 18 (SUTURE) ×2 IMPLANT
SUT SILK 2 0 SH (SUTURE) IMPLANT
SUT VIC AB 3-0 SH 27 (SUTURE) ×2
SUT VIC AB 3-0 SH 27X BRD (SUTURE) ×1 IMPLANT
SYR CONTROL 10ML LL (SYRINGE) ×2 IMPLANT
TOWEL OR 17X24 6PK STRL BLUE (TOWEL DISPOSABLE) ×4 IMPLANT
TOWEL OR NON WOVEN STRL DISP B (DISPOSABLE) ×2 IMPLANT
TUBE CONNECTING 20X1/4 (TUBING) ×2 IMPLANT
YANKAUER SUCT BULB TIP NO VENT (SUCTIONS) ×2 IMPLANT

## 2014-07-22 NOTE — Transfer of Care (Signed)
Immediate Anesthesia Transfer of Care Note  Patient: Amanda Kelly  Procedure(s) Performed: Procedure(s):  LEFT BREAST REEXCISION LUMPECTOMY (Left)  Patient Location: PACU  Anesthesia Type:General  Level of Consciousness: awake, alert  and oriented  Airway & Oxygen Therapy: Patient Spontanous Breathing and Patient connected to face mask oxygen  Post-op Assessment: Report given to RN and Post -op Vital signs reviewed and stable  Post vital signs: Reviewed and stable  Last Vitals:  Filed Vitals:   07/22/14 1441  BP: 140/85  Pulse: 98  Temp: 36.8 C  Resp: 18    Complications: No apparent anesthesia complications

## 2014-07-22 NOTE — Discharge Instructions (Signed)
[facility] °Office Phone Number [phone] ° °BREAST BIOPSY/ PARTIAL MASTECTOMY: POST OP INSTRUCTIONS ° °Always review your discharge instruction sheet given to you by the facility where your surgery was performed. ° °IF YOU HAVE DISABILITY OR FAMILY LEAVE FORMS, YOU MUST BRING THEM TO THE OFFICE FOR PROCESSING.  DO NOT GIVE THEM TO YOUR DOCTOR. ° °1. A prescription for pain medication may be given to you upon discharge.  Take your pain medication as prescribed, if needed.  If narcotic pain medicine is not needed, then you may take acetaminophen (Tylenol) or ibuprofen (Advil) as needed. °2. Take your usually prescribed medications unless otherwise directed °3. If you need a refill on your pain medication, please contact your pharmacy.  They will contact our office to request authorization.  Prescriptions will not be filled after 5pm or on week-ends. °4. You should eat very light the first 24 hours after surgery, such as soup, crackers, pudding, etc.  Resume your normal diet the day after surgery. °5. Most patients will experience some swelling and bruising in the breast.  Ice packs and a good support bra will help.  Swelling and bruising can take several days to resolve.  °6. It is common to experience some constipation if taking pain medication after surgery.  Increasing fluid intake and taking a stool softener will usually help or prevent this problem from occurring.  A mild laxative (Milk of Magnesia or Miralax) should be taken according to package directions if there are no bowel movements after 48 hours. °7. Unless discharge instructions indicate otherwise, you may remove your bandages 24-48 hours after surgery, and you may shower at that time.  You may have steri-strips (small skin tapes) in place directly over the incision.  These strips should be left on the skin for 7-10 days.  If your surgeon used skin glue on the incision, you may shower in 24 hours.  The glue will flake off over the  next 2-3 weeks.  Any sutures or staples will be removed at the office during your follow-up visit. °8. ACTIVITIES:  You may resume regular daily activities (gradually increasing) beginning the next day.  Wearing a good support bra or sports bra minimizes pain and swelling.  You may have sexual intercourse when it is comfortable. °a. You may drive when you no longer are taking prescription pain medication, you can comfortably wear a seatbelt, and you can safely maneuver your car and apply brakes. °b. RETURN TO WORK:  ______________________________________________________________________________________ °9. You should see your doctor in the office for a follow-up appointment approximately two weeks after your surgery.  Your doctor’s nurse will typically make your follow-up appointment when she calls you with your pathology report.  Expect your pathology report 2-3 business days after your surgery.  You may call to check if you do not hear from us after three days. °10. OTHER INSTRUCTIONS: _______________________________________________________________________________________________ _____________________________________________________________________________________________________________________________________ °_____________________________________________________________________________________________________________________________________ °_____________________________________________________________________________________________________________________________________ ° °WHEN TO CALL YOUR DOCTOR: °1. Fever over 101.0 °2. Nausea and/or vomiting. °3. Extreme swelling or bruising. °4. Continued bleeding from incision. °5. Increased pain, redness, or drainage from the incision. ° °The clinic staff is available to answer your questions during regular business hours.  Please don’t hesitate to call and ask to speak to one of the nurses for clinical concerns.  If you have a medical emergency, go to the nearest  emergency room or call 911.  A surgeon from [facility] is always on call at the hospital. ° °For further questions, please visit [email or web address]  ° ° ° °  Post Anesthesia Home Care Instructions ° °Activity: °Get plenty of rest for the remainder of the day. A responsible adult should stay with you for 24 hours following the procedure.  °For the next 24 hours, DO NOT: °-Drive a car °-Operate machinery °-Drink alcoholic beverages °-Take any medication unless instructed by your physician °-Make any legal decisions or sign important papers. ° °Meals: °Start with liquid foods such as gelatin or soup. Progress to regular foods as tolerated. Avoid greasy, spicy, heavy foods. If nausea and/or vomiting occur, drink only clear liquids until the nausea and/or vomiting subsides. Call your physician if vomiting continues. ° °Special Instructions/Symptoms: °Your throat may feel dry or sore from the anesthesia or the breathing tube placed in your throat during surgery. If this causes discomfort, gargle with warm salt water. The discomfort should disappear within 24 hours. ° °If you had a scopolamine patch placed behind your ear for the management of post- operative nausea and/or vomiting: ° °1. The medication in the patch is effective for 72 hours, after which it should be removed.  Wrap patch in a tissue and discard in the trash. Wash hands thoroughly with soap and water. °2. You may remove the patch earlier than 72 hours if you experience unpleasant side effects which may include dry mouth, dizziness or visual disturbances. °3. Avoid touching the patch. Wash your hands with soap and water after contact with the patch. °  ° °

## 2014-07-22 NOTE — Interval H&P Note (Signed)
History and Physical Interval Note:  07/22/2014 3:51 PM  Amanda Kelly  has presented today for surgery, with the diagnosis of left breast cancer  The various methods of treatment have been discussed with the patient and family. After consideration of risks, benefits and other options for treatment, the patient has consented to  Procedure(s):  LEFT BREAST REEXCISION LUMPECTOMY (Left) as a surgical intervention .  The patient's history has been reviewed, patient examined, no change in status, stable for surgery.  I have reviewed the patient's chart and labs.  Questions were answered to the patient's satisfaction.     Arian Murley A.

## 2014-07-22 NOTE — Anesthesia Procedure Notes (Signed)
Procedure Name: LMA Insertion Date/Time: 07/22/2014 4:14 PM Performed by: Lyndee Leo Pre-anesthesia Checklist: Patient identified, Emergency Drugs available, Suction available and Patient being monitored Patient Re-evaluated:Patient Re-evaluated prior to inductionOxygen Delivery Method: Circle System Utilized Preoxygenation: Pre-oxygenation with 100% oxygen Intubation Type: IV induction Ventilation: Mask ventilation without difficulty LMA: LMA inserted LMA Size: 4.0 Number of attempts: 1 Airway Equipment and Method: Bite block Placement Confirmation: positive ETCO2 Tube secured with: Tape Dental Injury: Teeth and Oropharynx as per pre-operative assessment

## 2014-07-22 NOTE — Op Note (Signed)
Preoperative diagnosis: Left breast phylloides tumor  Postoperative diagnosis: Same  Procedure: Reexcision left breast lumpectomy  Surgeon: Erroll Luna M.D.  Anesthesia: LMA with 0.25% Sensorcaine local with epinephrine  EBL: Minimal  Specimens: Margins from left breast lumpectomy to pathology  IV fluids 400 mL crystalloid  Indications for procedure: Patient is a 54 year old female with a lumpectomy earlier this month for a left breast mass that turned out to be a phylloides tumor low-grade. It was 3.4 cm. The deep margin was close and involved. Discussed the case with radiation oncology and excision recommended all the lumpectomy cavity which should help to keep her at a radiation therapy. She presents today for reexcision. Risk, benefits and alternative therapies discussed. She agreed to proceed.  Description of procedure: The patient was met in the holding area and questions are answered. Left breast was marked as correct side. Questions answered. Patient taken back to the operating room and placed upon the OR table. Left breast was prepped and draped in a sterile fashion after induction of LMA anesthesia. Timeout was done to verify proper procedure and patient. The medial incision from the lumpectomy was reopened. Seroma was evacuated. All margins were excised with thickness of at least 1 cm. Hemostasis achieved. The specimens were oriented and sent to pathology. The cavity was closed with 3-0 Vicryl and 4-0 Monocryl. Liquid adhesive applied. All counts found to be correct at the end of the case. The patient was awoke extubated taken to recovery in satisfactory condition.

## 2014-07-22 NOTE — Anesthesia Preprocedure Evaluation (Addendum)
Anesthesia Evaluation  Patient identified by MRN, date of birth, ID band Patient awake    Reviewed: Allergy & Precautions, NPO status , Patient's Chart, lab work & pertinent test results  Airway Mallampati: II  TM Distance: >3 FB Neck ROM: Full    Dental   Pulmonary shortness of breath, former smoker,  breath sounds clear to auscultation        Cardiovascular hypertension, Rhythm:Regular Rate:Normal     Neuro/Psych    GI/Hepatic Neg liver ROS, GERD-  ,  Endo/Other    Renal/GU negative Renal ROS     Musculoskeletal   Abdominal   Peds  Hematology   Anesthesia Other Findings   Reproductive/Obstetrics                           Anesthesia Physical Anesthesia Plan  ASA: III  Anesthesia Plan: General   Post-op Pain Management:    Induction: Intravenous  Airway Management Planned: LMA  Additional Equipment:   Intra-op Plan:   Post-operative Plan: Extubation in OR  Informed Consent: I have reviewed the patients History and Physical, chart, labs and discussed the procedure including the risks, benefits and alternatives for the proposed anesthesia with the patient or authorized representative who has indicated his/her understanding and acceptance.   Dental advisory given  Plan Discussed with: CRNA and Anesthesiologist  Anesthesia Plan Comments:        Anesthesia Quick Evaluation

## 2014-07-22 NOTE — H&P (View-Only) (Signed)
Amanda Kelly. [facility] 06/29/2014 9:46 AM Location: [facility] Patient #: [id number] DOB: [birth year] Married / Language: English / Race: White Female History of Present Illness Amanda Kelly A. [name] MD; 06/29/2014 4:22 PM) Patient words: breast cancer       Pt present for left breast mass core bx showing biphasic lesion.    Pt had this discovered on mammogram. No symptoms.       CLINICAL DATA: Patient notes a palpable lump within the left breast for 2 months. The patient has a family history breast cancer in a grandmother at age 50 and 3 maternal cousins (premenopausal). She has undergone a previous left breast ultrasound-guided core biopsy in December, 2013 which demonstrated extensive stromal fibrosis. She has also undergone a previous benign left breast surgical excisional biopsy within the upper-outer quadrant of the left breast.  EXAM: DIGITAL DIAGNOSTIC bilateral MAMMOGRAM WITH 3D TOMOSYNTHESIS WITH CAD  ULTRASOUND left BREAST  COMPARISON: 03/26/2012, 03/14/2012, 03/01/2012, 05/12/2009, 05/11/2008.  ACR Breast Density Category c: The breast tissue is heterogeneously dense, which may obscure small masses.  FINDINGS: Two groups of calcifications are seen within the right breast 1 group is located inferiorly within the right breast at approximately the 6:30 o'clock position and spans 10 mm. The majority of calcifications within this group are punctate. There are several amorphous calcifications associated with this group. There is a second group of calcifications located within the upper inner quadrant of the right breast which span 11 mm. These vary in size and shape and are suspicious for possible DCIS. Tissue sampling via stereotactic core biopsy is recommended. This has been scheduled for 06/03/2014. Depending on the results of this biopsy, stereotactic biopsy of the second group of calcifications located at the 6:30 o'clock position may be  needed.  There is an oval, macrolobulated mass located within the upper inner quadrant of the left breast with a clip associated with the anterior portion of this mass. This mass has increased in size mammographically. There is distortion located within the upper outer quadrant left breast related to the patient's previous benign surgical excisional biopsy.  Mammographic images were processed with CAD.  On physical exam, there is a mobile, palpable mass located within the left breast at the 9:30 o'clock position 5 cm from the nipple. There is no palpable left axillary adenopathy.  Targeted ultrasound is performed, showing a heterogeneous mass with slightly irregular margins located within the left breast at the 9:30 o'clock position 5 cm from the nipple. This measures 2.1 x 1.4 x 1.6 cm in size. This corresponds to the palpable mass. Tissue sampling via ultrasound-guided core biopsy is recommended. This is scheduled for 06/03/2014.  Ultrasound of the left axilla demonstrates normal axillary contents and no evidence for adenopathy.  IMPRESSION: 1. 2.1 cm mass located within the upper inner quadrant left breast 9:30 o'clock position 5 cm from the nipple. Tissue sampling via ultrasound-guided core biopsy is recommended and is scheduled for 06/03/2014.  2. Two groups of calcifications located within the right breast as discussed above. Stereotactic core biopsy of the grouped calcifications located within the upper inner quadrant of the right breast is suggested and is scheduled for 06/03/2014. Depending on the results of this biopsy stereotactic core biopsy of the second group calcifications may be needed.  RECOMMENDATION: Left breast ultrasound-guided core biopsy and right breast stereotactic core biopsy.  I have discussed the findings and recommendations with the patient. Results were also provided in writing at the conclusion of the visit. If applicable, a reminder  letter  will be sent to the patient regarding the next appointment.  BI-RADS CATEGORY 4: Suspicious.   Electronically Signed By: Altamese Cabal M.D. On: 05/25/2014 12:50  CLINICAL DATA: 54 year old female status post ultrasound-guided biopsy of the left breast and stereotactic guided biopsy of the right breast  EXAM: DIAGNOSTIC BILATERAL MAMMOGRAM POST ULTRASOUND BIOPSY OF THE LEFT BREAST AND STEREOTACTIC BIOPSY OF THE RIGHT BREAST  COMPARISON: Previous exam(s).  FINDINGS: Mammographic images were obtained following ultrasound guided biopsy of the left breast and stereotactic guided biopsy of the right breast. Post biopsy mammogram confirms the X shaped clip to be within the expected position within the upper, inner right breast and the heart shaped clip to be within the expected position within the upper, inner left breast.  IMPRESSION: Satisfactory clip placement within both breasts as above.  Final Assessment: Post Procedure Mammograms for Marker Placement     Breast, left, needle core biopsy, 9:30 o'clock, 5 CMFN - BIPHASIC (STROMAL/EPITHELIAL) LESION. - SEE COMMENT.  The patient is a 54 year old female   Other Problems Amanda Kelly, CMA; 06/29/2014 9:46 AM) Anxiety Disorder Gastroesophageal Reflux Disease Lump In Breast  Past Surgical History Amanda Kelly, CMA; 06/29/2014 9:46 AM) Breast Biopsy multiple  Diagnostic Studies History Amanda Kelly, CMA; 06/29/2014 9:46 AM) Colonoscopy within last year Mammogram within last year Pap Smear 1-5 years ago  Allergies Amanda Kelly, CMA; 06/29/2014 9:47 AM) No Known Drug Allergies 06/29/2014  Medication History Amanda Kelly, CMA; 06/29/2014 9:48 AM) Atorvastatin Calcium (40MG  Tablet, Oral) Active. DULoxetine HCl (60MG  Capsule DR Part, Oral) Active. Metoprolol Succinate ER (50MG  Tablet ER 24HR, Oral) Active. Omeprazole (20MG  Capsule DR, Oral) Active. Diflucan (150MG  Tablet, Oral) Active. Medications  Reconciled Hydrocodone-Acetaminophen (2.5-500MG  Tablet, Oral) Active.  Social History Amanda Kelly, [place]; 06/29/2014 9:46 AM) Alcohol use Occasional alcohol use. Caffeine use Coffee. No drug use Tobacco use Former smoker.  Family History Amanda Kelly, [place]; 06/29/2014 9:46 AM) Breast Cancer Family Members In General.  Pregnancy / Birth History Amanda Kelly, CMA; 06/29/2014 9:46 AM) Age at menarche 37 years. Age of menopause 75-50 Gravida 0 Irregular periods Para 0     Review of Systems Amanda Kelly CMA; 06/29/2014 9:46 AM) General Present- Fatigue and Night Sweats. Not Present- Appetite Loss, Chills, Fever, Weight Gain and Weight Loss. Skin Not Present- Change in Wart/Mole, Dryness, Hives, Jaundice, New Lesions, Non-Healing Wounds, Rash and Ulcer. HEENT Present- Wears glasses/contact lenses. Not Present- Earache, Hearing Loss, Hoarseness, Nose Bleed, Oral Ulcers, Ringing in the Ears, Seasonal Allergies, Sinus Pain, Sore Throat, Visual Disturbances and Yellow Eyes. Respiratory Not Present- Bloody sputum, Chronic Cough, Difficulty Breathing, Snoring and Wheezing. Breast Present- Breast Mass. Not Present- Breast Pain, Nipple Discharge and Skin Changes. Cardiovascular Present- Rapid Heart Rate. Not Present- Chest Pain, Difficulty Breathing Lying Down, Leg Cramps, Palpitations, Shortness of Breath and Swelling of Extremities. Gastrointestinal Not Present- Abdominal Pain, Bloating, Bloody Stool, Change in Bowel Habits, Chronic diarrhea, Constipation, Difficulty Swallowing, Excessive gas, Gets full quickly at meals, Hemorrhoids, Indigestion, Nausea, Rectal Pain and Vomiting. Female Genitourinary Not Present- Frequency, Nocturia, Painful Urination, Pelvic Pain and Urgency. Musculoskeletal Not Present- Back Pain, Joint Pain, Joint Stiffness, Muscle Pain, Muscle Weakness and Swelling of Extremities. Neurological Present- Numbness and Tingling. Not Present- Decreased Memory, Fainting,  Headaches, Seizures, Tremor, Trouble walking and Weakness. Psychiatric Present- Anxiety. Not Present- Bipolar, Change in Sleep Pattern, Depression, Fearful and Frequent crying. Endocrine Present- Hot flashes. Not Present- Cold Intolerance, Excessive Hunger, Hair Changes, Heat Intolerance and New Diabetes.  Vitals Amanda Kelly  CMA; 06/29/2014 9:49 AM) 06/29/2014 9:48 AM Weight: 247 lb Height: 65in Body Surface Area: 2.27 m Body Mass Index: 41.1 kg/m Temp.: 97.28F(Temporal)  Pulse: 102 (Regular)  Resp.: 18 (Unlabored)  BP: 150/82 (Sitting, Left Arm, Standard)     Physical Exam (Hersey Maclellan A. Jillienne Egner MD; 06/29/2014 4:22 PM)  General Mental Status-Alert. General Appearance-Consistent with stated age. Hydration-Well hydrated. Voice-Normal.  Head and Neck Head-normocephalic, atraumatic with no lesions or palpable masses. Trachea-midline. Thyroid Gland Characteristics - normal size and consistency.  Eye Eyeball - Bilateral-Extraocular movements intact. Sclera/Conjunctiva - Bilateral-No scleral icterus.  Chest and Lung Exam Chest and lung exam reveals -quiet, even and easy respiratory effort with no use of accessory muscles and on auscultation, normal breath sounds, no adventitious sounds and normal vocal resonance. Inspection Chest Wall - Normal. Back - normal.  Breast Breast - Left-Symmetric, Non Tender, No Biopsy scars, no Dimpling, No Inflammation, No Lumpectomy scars, No Mastectomy scars, No Peau d' Orange. Breast - Right-Symmetric, Non Tender, No Biopsy scars, no Dimpling, No Inflammation, No Lumpectomy scars, No Mastectomy scars, No Peau d' Orange. Breast Lump-No Palpable Breast Mass.  Cardiovascular Cardiovascular examination reveals -normal heart sounds, regular rate and rhythm with no murmurs and normal pedal pulses bilaterally.  Abdomen Inspection Inspection of the abdomen reveals - No Hernias. Skin - Scar - no surgical  scars. Palpation/Percussion Palpation and Percussion of the abdomen reveal - Soft, Non Tender, No Rebound tenderness, No Rigidity (guarding) and No hepatosplenomegaly. Auscultation Auscultation of the abdomen reveals - Bowel sounds normal.  Neurologic Neurologic evaluation reveals -alert and oriented x 3 with no impairment of recent or remote memory. Mental Status-Normal.  Musculoskeletal Normal Exam - Left-Upper Extremity Strength Normal and Lower Extremity Strength Normal. Normal Exam - Right-Upper Extremity Strength Normal and Lower Extremity Strength Normal.  Lymphatic Head & Neck  General Head & Neck Lymphatics: Bilateral - Description - Normal. Axillary  General Axillary Region: Bilateral - Description - Normal. Tenderness - Non Tender. Femoral & Inguinal  Generalized Femoral & Inguinal Lymphatics: Bilateral - Description - Normal. Tenderness - Non Tender.    Assessment & Plan (Nuriya Stuck A. Johnnie Goynes MD; 06/29/2014 4:22 PM)  LEFT BREAST MASS (611.72  N63) Impression: biphasic lesion 2.1 cm. recommend excision to exclude phylloides tumor. Risk of lumpectomy include bleeding, infection, seroma, more surgery, use of seed/wire, wound care, cosmetic deformity and the need for other treatments, death , blood clots, death. Pt agrees to proceed.  Current Plans Pt Education - [facility] Breast Biopsy [facility]

## 2014-07-23 ENCOUNTER — Encounter (HOSPITAL_BASED_OUTPATIENT_CLINIC_OR_DEPARTMENT_OTHER): Payer: Self-pay | Admitting: Surgery

## 2014-07-23 NOTE — Anesthesia Postprocedure Evaluation (Signed)
  Anesthesia Post-op Note  Patient: Amanda Kelly  Procedure(s) Performed: Procedure(s):  LEFT BREAST REEXCISION LUMPECTOMY (Left)  Patient Location: PACU  Anesthesia Type:General  Level of Consciousness: awake  Airway and Oxygen Therapy: Patient Spontanous Breathing  Post-op Pain: mild  Post-op Assessment: Post-op Vital signs reviewed  Post-op Vital Signs: Reviewed  Last Vitals:  Filed Vitals:   07/22/14 1740  BP: 146/86  Pulse: 92  Temp: 36.7 C  Resp: 20    Complications: No apparent anesthesia complications

## 2014-07-31 ENCOUNTER — Other Ambulatory Visit: Payer: Self-pay | Admitting: Cardiovascular Disease

## 2014-08-03 ENCOUNTER — Other Ambulatory Visit: Payer: Self-pay | Admitting: Nurse Practitioner

## 2014-08-07 ENCOUNTER — Encounter (INDEPENDENT_AMBULATORY_CARE_PROVIDER_SITE_OTHER): Payer: Self-pay

## 2014-08-07 ENCOUNTER — Ambulatory Visit (INDEPENDENT_AMBULATORY_CARE_PROVIDER_SITE_OTHER): Payer: BLUE CROSS/BLUE SHIELD | Admitting: Nurse Practitioner

## 2014-08-07 ENCOUNTER — Encounter: Payer: Self-pay | Admitting: Nurse Practitioner

## 2014-08-07 VITALS — BP 183/120 | HR 99 | Temp 98.6°F | Ht 65.0 in | Wt 249.0 lb

## 2014-08-07 DIAGNOSIS — F341 Dysthymic disorder: Secondary | ICD-10-CM | POA: Diagnosis not present

## 2014-08-07 DIAGNOSIS — K219 Gastro-esophageal reflux disease without esophagitis: Secondary | ICD-10-CM

## 2014-08-07 DIAGNOSIS — R000 Tachycardia, unspecified: Secondary | ICD-10-CM | POA: Diagnosis not present

## 2014-08-07 DIAGNOSIS — E785 Hyperlipidemia, unspecified: Secondary | ICD-10-CM | POA: Diagnosis not present

## 2014-08-07 DIAGNOSIS — Z720 Tobacco use: Secondary | ICD-10-CM | POA: Diagnosis not present

## 2014-08-07 DIAGNOSIS — Z87891 Personal history of nicotine dependence: Secondary | ICD-10-CM

## 2014-08-07 DIAGNOSIS — I10 Essential (primary) hypertension: Secondary | ICD-10-CM | POA: Diagnosis not present

## 2014-08-07 MED ORDER — CLONAZEPAM 0.5 MG PO TABS: 0.5000 mg | ORAL_TABLET | Freq: Two times a day (BID) | ORAL | Status: DC | PRN

## 2014-08-07 MED ORDER — LISINOPRIL-HYDROCHLOROTHIAZIDE 20-12.5 MG PO TABS: 1.0000 | ORAL_TABLET | Freq: Every day | ORAL | Status: DC

## 2014-08-07 MED ORDER — DULOXETINE HCL 60 MG PO CPEP: ORAL_CAPSULE | ORAL | Status: DC

## 2014-08-07 MED ORDER — OMEPRAZOLE MAGNESIUM 20 MG PO TBEC: 20.0000 mg | DELAYED_RELEASE_TABLET | Freq: Every day | ORAL | Status: DC

## 2014-08-07 MED ORDER — ATORVASTATIN CALCIUM 40 MG PO TABS: ORAL_TABLET | ORAL | Status: DC

## 2014-08-07 MED ORDER — METOPROLOL SUCCINATE ER 50 MG PO TB24: ORAL_TABLET | ORAL | Status: DC

## 2014-08-07 NOTE — Addendum Note (Signed)
Addended by: Chevis Pretty on: 08/07/2014 04:34 PM   Modules accepted: Orders

## 2014-08-07 NOTE — Progress Notes (Signed)
Subjective:    Patient ID: Amanda Kelly, female    DOB: 1961/03/09, 54 y.o.   MRN: [id number]   Patient here today for follow up of chronic medical problems.   Hypertension This is a chronic problem. The current episode started more than 1 year ago. The problem is unchanged. The problem is uncontrolled. Pertinent negatives include no chest pain, headaches, palpitations or shortness of breath. Risk factors for coronary artery disease include dyslipidemia, obesity and post-menopausal state. Past treatments include beta blockers. The current treatment provides mild improvement. Compliance problems include diet and exercise.   Hyperlipidemia This is a chronic problem. The current episode started more than 1 year ago. Recent lipid tests were reviewed and are variable. Pertinent negatives include no chest pain or shortness of breath. Current antihyperlipidemic treatment includes statins. The current treatment provides moderate improvement of lipids. Compliance problems include adherence to diet and adherence to exercise.  Risk factors for coronary artery disease include dyslipidemia, hypertension, post-menopausal and obesity.  Depression/GAD Cymbalta-works great- can really tell a difference if she misses a dose. Cries a lot lots of worrying Breast cancer recurrent Has had lumpectomy with clear margins- Is not going to see oncologist- they are just going to watch for now. Tachycardia Metoprolol helps keep heart rate down.    Review of Systems  Constitutional: Negative.   HENT: Negative.   Respiratory: Negative for shortness of breath.   Cardiovascular: Negative for chest pain and palpitations.  Genitourinary: Negative.   Neurological: Negative.  Negative for headaches.  Psychiatric/Behavioral: Negative.   All other systems reviewed and are negative.      Objective:   Physical Exam  Constitutional: She is oriented to person, place, and time. She appears well-developed and  well-nourished.  HENT:  Nose: Nose normal.  Mouth/Throat: Oropharynx is clear and moist.  Eyes: EOM are normal.  Neck: Trachea normal, normal range of motion and full passive range of motion without pain. Neck supple. No JVD present. Carotid bruit is not present. No thyromegaly present.  Cardiovascular: Normal rate, regular rhythm, normal heart sounds and intact distal pulses.  Exam reveals no gallop and no friction rub.   No murmur heard. Pulmonary/Chest: Effort normal and breath sounds normal.  Abdominal: Soft. Bowel sounds are normal. She exhibits no distension and no mass. There is tenderness (slight epigastric pain).  Musculoskeletal: Normal range of motion.  Lymphadenopathy:    She has no cervical adenopathy.  Neurological: She is alert and oriented to person, place, and time. She has normal reflexes.  Skin: Skin is warm and dry.  Psychiatric: She has a normal mood and affect. Her behavior is normal. Judgment and thought content normal.   BP 183/120 mmHg  Pulse 99  Temp(Src) 98.6 F (37 C) (Oral)  Ht 5' 5"  (1.651 m)  Wt 249 lb (112.946 kg)  BMI 41.44 kg/m2         Assessment & Plan:   1. Essential hypertension Do not add slat to diet - lisinopril-hydrochlorothiazide (ZESTORETIC) 20-12.5 MG per tablet; Take 1 tablet by mouth daily.  Dispense: 30 tablet; Refill: 5 - CMP14+EGFR  2. Tachycardia Avoid caffeine - metoprolol succinate (TOPROL-XL) 50 MG 24 hr tablet; TAKE 1 TABLET (50 MG TOTAL) BY MOUTH DAILY. TAKE WITH OR IMMEDIATELY FOLLOWING A MEAL.  Dispense: 30 tablet; Refill: 5  3. Hyperlipidemia with target LDL less than 100 Low fat diet - atorvastatin (LIPITOR) 40 MG tablet; TAKE 1 TABLET (40 MG TOTAL) BY MOUTH DAILY.  Dispense: 30 tablet; Refill:  5 - NMR, lipoprofile  4. Hx of smoking  5. ANXIETY DEPRESSION Stress management - DULoxetine (CYMBALTA) 60 MG capsule; TAKE 1 CAPSULE (60 MG TOTAL) BY MOUTH DAILY.  Dispense: 30 capsule; Refill: 5 - Klonopin 0.5 mg  BID #60 1 refill 6. Gastroesophageal reflux disease without esophagitis Avoid spicy foods Do not eat 2 hours prior to bedtime  - omeprazole (PRILOSEC OTC) 20 MG tablet; Take 1 tablet (20 mg total) by mouth daily.  Dispense: 30 tablet; Refill: 5    Labs pending Health maintenance reviewed Diet and exercise encouraged Continue all meds Follow up  In 6 month   Casey, FNP

## 2014-08-07 NOTE — Patient Instructions (Signed)
Stress and Stress Management Stress is a normal reaction to life events. It is what you feel when life demands more than you are used to or more than you can handle. Some stress can be useful. For example, the stress reaction can help you catch the last bus of the day, study for a test, or meet a deadline at work. But stress that occurs too often or for too long can cause problems. It can affect your emotional health and interfere with relationships and normal daily activities. Too much stress can weaken your immune system and increase your risk for physical illness. If you already have a medical problem, stress can make it worse. CAUSES  All sorts of life events may cause stress. An event that causes stress for one person may not be stressful for another person. Major life events commonly cause stress. These may be positive or negative. Examples include losing your job, moving into a new home, getting married, having a baby, or losing a loved one. Less obvious life events may also cause stress, especially if they occur day after day or in combination. Examples include working long hours, driving in traffic, caring for children, being in debt, or being in a difficult relationship. SIGNS AND SYMPTOMS Stress may cause emotional symptoms including, the following:  Anxiety. This is feeling worried, afraid, on edge, overwhelmed, or out of control.  Anger. This is feeling irritated or impatient.  Depression. This is feeling sad, down, helpless, or guilty.  Difficulty focusing, remembering, or making decisions. Stress may cause physical symptoms, including the following:   Aches and pains. These may affect your head, neck, back, stomach, or other areas of your body.  Tight muscles or clenched jaw.  Low energy or trouble sleeping. Stress may cause unhealthy behaviors, including the following:   Eating to feel better (overeating) or skipping meals.  Sleeping too little, too much, or both.  Working  too much or putting off tasks (procrastination).  Smoking, drinking alcohol, or using drugs to feel better. DIAGNOSIS  Stress is diagnosed through an assessment by your health care provider. Your health care provider will ask questions about your symptoms and any stressful life events.Your health care provider will also ask about your medical history and may order blood tests or other tests. Certain medical conditions and medicine can cause physical symptoms similar to stress. Mental illness can cause emotional symptoms and unhealthy behaviors similar to stress. Your health care provider may refer you to a mental health professional for further evaluation.  TREATMENT  Stress management is the recommended treatment for stress.The goals of stress management are reducing stressful life events and coping with stress in healthy ways.  Techniques for reducing stressful life events include the following:  Stress identification. Self-monitor for stress and identify what causes stress for you. These skills may help you to avoid some stressful events.  Time management. Set your priorities, keep a calendar of events, and learn to say "no." These tools can help you avoid making too many commitments. Techniques for coping with stress include the following:  Rethinking the problem. Try to think realistically about stressful events rather than ignoring them or overreacting. Try to find the positives in a stressful situation rather than focusing on the negatives.  Exercise. Physical exercise can release both physical and emotional tension. The key is to find a form of exercise you enjoy and do it regularly.  Relaxation techniques. These relax the body and mind. Examples include yoga, meditation, tai chi, biofeedback, deep  breathing, progressive muscle relaxation, listening to music, being out in nature, journaling, and other hobbies. Again, the key is to find one or more that you enjoy and can do  regularly.  Healthy lifestyle. Eat a balanced diet, get plenty of sleep, and do not smoke. Avoid using alcohol or drugs to relax.  Strong support network. Spend time with family, friends, or other people you enjoy being around.Express your feelings and talk things over with someone you trust. Counseling or talktherapy with a mental health professional may be helpful if you are having difficulty managing stress on your own. Medicine is typically not recommended for the treatment of stress.Talk to your health care provider if you think you need medicine for symptoms of stress. HOME CARE INSTRUCTIONS  Keep all follow-up visits as directed by your health care provider.  Take all medicines as directed by your health care provider. SEEK MEDICAL CARE IF:  Your symptoms get worse or you start having new symptoms.  You feel overwhelmed by your problems and can no longer manage them on your own. SEEK IMMEDIATE MEDICAL CARE IF:  You feel like hurting yourself or someone else. Document Released: 09/06/2000 Document Revised: 07/28/2013 Document Reviewed: 11/05/2012 [employer] Patient Information 2015 [employer]. This information is not intended to replace advice given to you by your health care provider. Make sure you discuss any questions you have with your health care provider.  

## 2014-08-12 NOTE — Progress Notes (Signed)
Patient ID: Amanda Kelly, female   DOB: 08-11-1960, 54 y.o.   MRN: [id number] 53 y.o.  previously seen for tachycardia with normal w/u. Do to relatively high resting HR likely form obesity and deconditioning she feels better on beta blocker. No SSCP. Has some radicular symptoms in left hand. Had OB/GYN procedure and hormonal issues. ? Hirtutism Not watching diet or exercising. Compliant with meds. No palpitations dyspnea or presyncope.  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 Quit smoking and I congratulated her on this  Recent lumpectomy x 2 for breast cancer will need XRT Feeling more flushed with elevated HR and BP  Primary started her on ACE/diuretic  ROS: Denies fever, malais, weight loss, blurry vision, decreased visual acuity, cough, sputum, SOB, hemoptysis, pleuritic pain, palpitaitons, heartburn, abdominal pain, melena, lower extremity edema, claudication, or rash.  All other systems reviewed and negative  General: Affect appropriate Overweight white female HEENT: normal Neck supple with no adenopathy JVP normal no bruits no thyromegaly Lungs clear with no wheezing and good diaphragmatic motion Heart:  S1/S2 no murmur, no rub, gallop or click PMI normal Abdomen: benighn, BS positve, no tenderness, no AAA no bruit.  No HSM or HJR Distal pulses intact with no bruits No edema Neuro non-focal Skin warm and dry No muscular weakness   Current Outpatient Prescriptions  Medication Sig Dispense Refill  . atorvastatin (LIPITOR) 40 MG tablet TAKE 1 TABLET (40 MG TOTAL) BY MOUTH DAILY. 30 tablet 5  . clonazePAM (KLONOPIN) 0.5 MG tablet Take 1 tablet (0.5 mg total) by mouth 2 (two) times daily as needed for anxiety. 60 tablet 1  . DULoxetine (CYMBALTA) 60 MG capsule TAKE 1 CAPSULE (60 MG TOTAL) BY MOUTH DAILY. 30 capsule 5  . lisinopril-hydrochlorothiazide (ZESTORETIC) 20-12.5 MG per tablet Take 1 tablet by mouth daily. 30 tablet 5  . metoprolol succinate (TOPROL-XL) 50 MG 24 hr  tablet TAKE 1 TABLET (50 MG TOTAL) BY MOUTH DAILY. TAKE WITH OR IMMEDIATELY FOLLOWING A MEAL. 30 tablet 5  . omeprazole (PRILOSEC OTC) 20 MG tablet Take 1 tablet (20 mg total) by mouth daily. 30 tablet 5   No current facility-administered medications for this visit.    Allergies  Review of patient's allergies indicates no known allergies.  Electrocardiogram:  07/27/11  SR rate 98 normal ECG  Assessment and Plan HTN:  Flushing and recurrent tachycardia has had negative urine metanephrines f/u CT r/o adenoma  Increase Toprol 100 mg Tachycardia: r/o pheo CT  Increase Toprol  Not clear what drives her HR Update echo to reassess EF Breast CA:  S/p lumpectomy x 2 on left f/u oncology not planning XRT at this time  F/U with me 3 months

## 2014-08-13 ENCOUNTER — Ambulatory Visit (INDEPENDENT_AMBULATORY_CARE_PROVIDER_SITE_OTHER): Payer: BLUE CROSS/BLUE SHIELD | Admitting: Cardiovascular Disease

## 2014-08-13 ENCOUNTER — Encounter: Payer: Self-pay | Admitting: Cardiovascular Disease

## 2014-08-13 VITALS — BP 124/82 | HR 114 | Ht 65.0 in | Wt 248.8 lb

## 2014-08-13 DIAGNOSIS — D3500 Benign neoplasm of unspecified adrenal gland: Secondary | ICD-10-CM | POA: Diagnosis not present

## 2014-08-13 DIAGNOSIS — R000 Tachycardia, unspecified: Secondary | ICD-10-CM | POA: Diagnosis not present

## 2014-08-13 MED ORDER — METOPROLOL SUCCINATE ER 100 MG PO TB24: ORAL_TABLET | ORAL | Status: DC

## 2014-08-13 NOTE — Patient Instructions (Signed)
Medication Instructions:  Your physician has recommended you make the following change in your medication:  INCREASE  METOPROLOL TO   100 MG EVERY DAY  Labwork: NONE  Testing/Procedures: CT OF  ABDOMEN Your physician has requested that you have an echocardiogram. Echocardiography is a painless test that uses sound waves to create images of your heart. It provides your doctor with information about the size and shape of your heart and how well your heart's chambers and valves are working. This procedure takes approximately one hour. There are no restrictions for this procedure.   Follow-Up: Your physician recommends that you schedule a follow-up appointment in:   [place]  Any Other Special Instructions Will Be Listed Below (If Applicable). \

## 2014-08-19 ENCOUNTER — Ambulatory Visit: Payer: Self-pay | Admitting: Nurse Practitioner

## 2014-08-20 ENCOUNTER — Ambulatory Visit (HOSPITAL_COMMUNITY): Payer: BLUE CROSS/BLUE SHIELD | Attending: Cardiovascular Disease

## 2014-08-20 ENCOUNTER — Ambulatory Visit (INDEPENDENT_AMBULATORY_CARE_PROVIDER_SITE_OTHER)
Admission: RE | Admit: 2014-08-20 | Discharge: 2014-08-20 | Disposition: A | Discharge status: Home / Self Care | Payer: BLUE CROSS/BLUE SHIELD | Source: Ambulatory Visit | Attending: Cardiovascular Disease | Admitting: Cardiovascular Disease

## 2014-08-20 ENCOUNTER — Other Ambulatory Visit: Payer: Self-pay

## 2014-08-20 ENCOUNTER — Other Ambulatory Visit: Payer: Self-pay | Admitting: Cardiovascular Disease

## 2014-08-20 DIAGNOSIS — Z87891 Personal history of nicotine dependence: Secondary | ICD-10-CM | POA: Insufficient documentation

## 2014-08-20 DIAGNOSIS — I10 Essential (primary) hypertension: Secondary | ICD-10-CM | POA: Insufficient documentation

## 2014-08-20 DIAGNOSIS — R000 Tachycardia, unspecified: Secondary | ICD-10-CM

## 2014-08-20 DIAGNOSIS — D3500 Benign neoplasm of unspecified adrenal gland: Secondary | ICD-10-CM

## 2014-08-20 DIAGNOSIS — E785 Hyperlipidemia, unspecified: Secondary | ICD-10-CM | POA: Insufficient documentation

## 2014-08-20 MED ORDER — PERFLUTREN LIPID MICROSPHERE
1.0000 mL | Freq: Once | INTRAVENOUS | Status: AC
  Administered 2014-08-20: 1 mL via INTRAVENOUS

## 2014-08-20 NOTE — Progress Notes (Signed)
2D Echo with [facility] completed. 08/20/2014

## 2014-09-03 NOTE — Progress Notes (Signed)
Spoke with pt and she states she did not know about the appt that was scheduled for her in March. There is documentation where she was given the appt and instructions. Pt states she just had a CT of abd done that was ordered by Dr. Johnsie Cancel. Does she need anything further?

## 2014-10-24 ENCOUNTER — Other Ambulatory Visit: Payer: Self-pay | Admitting: Nurse Practitioner

## 2014-10-24 ENCOUNTER — Other Ambulatory Visit: Payer: Self-pay | Admitting: Cardiovascular Disease

## 2014-11-04 ENCOUNTER — Other Ambulatory Visit: Payer: Self-pay

## 2014-11-04 DIAGNOSIS — R000 Tachycardia, unspecified: Secondary | ICD-10-CM

## 2014-11-04 MED ORDER — METOPROLOL SUCCINATE ER 100 MG PO TB24: ORAL_TABLET | ORAL | Status: DC

## 2014-11-19 ENCOUNTER — Ambulatory Visit: Payer: BLUE CROSS/BLUE SHIELD | Admitting: Cardiovascular Disease

## 2014-12-15 ENCOUNTER — Other Ambulatory Visit: Payer: Self-pay | Admitting: Cardiovascular Disease

## 2014-12-16 ENCOUNTER — Other Ambulatory Visit: Payer: Self-pay | Admitting: Surgery

## 2014-12-16 DIAGNOSIS — R921 Mammographic calcification found on diagnostic imaging of breast: Secondary | ICD-10-CM

## 2014-12-30 ENCOUNTER — Ambulatory Visit (INDEPENDENT_AMBULATORY_CARE_PROVIDER_SITE_OTHER): Payer: BLUE CROSS/BLUE SHIELD

## 2014-12-30 DIAGNOSIS — Z23 Encounter for immunization: Secondary | ICD-10-CM | POA: Diagnosis not present

## 2015-01-01 ENCOUNTER — Ambulatory Visit
Admission: RE | Admit: 2015-01-01 | Discharge: 2015-01-01 | Disposition: A | Discharge status: Home / Self Care | Payer: BLUE CROSS/BLUE SHIELD | Source: Ambulatory Visit | Attending: Surgery | Admitting: Surgery

## 2015-01-11 ENCOUNTER — Other Ambulatory Visit: Payer: Self-pay | Admitting: Surgery

## 2015-01-11 DIAGNOSIS — Z803 Family history of malignant neoplasm of breast: Secondary | ICD-10-CM

## 2015-01-21 ENCOUNTER — Telehealth: Payer: Self-pay | Admitting: Genetic Counselor

## 2015-01-21 NOTE — Telephone Encounter (Signed)
Contacted pt regarding genetic appt. Pt confirmed appt.

## 2015-01-22 ENCOUNTER — Telehealth: Payer: Self-pay | Admitting: *Deleted

## 2015-01-22 NOTE — Telephone Encounter (Signed)
Received referral from [facility].  Called pt and confirmed 01/26/15 high risk appt w/ her.  Unable to mail packet - gave verbal, directions, instructions and placed a note for an intake form to be given at time of check in.  Emailed Shavon at [facility] to make her aware.  Placed notes in Dr. Ernestina Penna box and took one to HIM to scan.

## 2015-01-25 ENCOUNTER — Encounter: Payer: Self-pay | Admitting: *Deleted

## 2015-01-25 NOTE — Progress Notes (Signed)
Patient ID: Amanda Kelly, female   DOB: [birth year], 54 y.o.   MRN: [id number] 54 y.o.  previously seen for tachycardia with normal w/u. Do to relatively high resting HR likely form obesity and deconditioning she feels better on beta blocker. No SSCP. Has some radicular symptoms in left hand. Had OB/GYN procedure and hormonal issues. ? Hirtutism Not watching diet or exercising. Compliant with meds. No palpitations dyspnea or presyncope.  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 Quit smoking and I congratulated her on this  Recent lumpectomy x 2 for breast cancer will need XRT Sister has metastatic breast CA and she is going to [place] to see her a lot  Has seen a geneticist to see if she has the "breast cancer " gene BRCA1 Primary started her on ACE/diuretic  Echo 07/2014 normal EF 60-65% grade one diastolic dysfunction  ROS: Denies fever, malais, weight loss, blurry vision, decreased visual acuity, cough, sputum, SOB, hemoptysis, pleuritic pain, palpitaitons, heartburn, abdominal pain, melena, lower extremity edema, claudication, or rash.  All other systems reviewed and negative  General: Affect appropriate Overweight white female HEENT: normal Neck supple with no adenopathy JVP normal no bruits no thyromegaly Lungs clear with no wheezing and good diaphragmatic motion Heart:  S1/S2 no murmur, no rub, gallop or click PMI normal Abdomen: benighn, BS positve, no tenderness, no AAA no bruit.  No HSM or HJR Distal pulses intact with no bruits No edema Neuro non-focal Skin warm and dry No muscular weakness   Current Outpatient Prescriptions  Medication Sig Dispense Refill  . atorvastatin (LIPITOR) 40 MG tablet TAKE 1 TABLET (40 MG TOTAL) BY MOUTH DAILY. 30 tablet 5  . DULoxetine (CYMBALTA) 60 MG capsule TAKE 1 CAPSULE (60 MG TOTAL) BY MOUTH DAILY. 30 capsule 5  . lisinopril-hydrochlorothiazide (ZESTORETIC) 20-12.5 MG per tablet Take 1 tablet by mouth daily. 30 tablet 5  .  metoprolol succinate (TOPROL-XL) 100 MG 24 hr tablet TAKE 1 TABLET (100 MG TOTAL) BY MOUTH DAILY. TAKE WITH OR IMMEDIATELY FOLLOWING A MEAL. 90 tablet 3  . omeprazole (PRILOSEC OTC) 20 MG tablet Take 1 tablet (20 mg total) by mouth daily. 30 tablet 5   No current facility-administered medications for this visit.    Allergies  Review of patient's allergies indicates no known allergies.  Electrocardiogram:  07/27/11  SR rate 98 normal ECG  Assessment and Plan HTN:  Flushing and recurrent tachycardia has had negative urine metanephrines CT May 2016 no adrenal mass.  Better with higher dose Toprol Tachycardia: better on beta blocker no evidence of pheo,,normal TSH no structural heart disease on echo  Breast CA:  S/p lumpectomy x 2 on left f/u oncology not planning XRT at this time BRCA! Gene testing next week  F/U with me 6 months

## 2015-01-26 ENCOUNTER — Encounter: Payer: Self-pay | Admitting: Hematology

## 2015-01-26 ENCOUNTER — Ambulatory Visit (HOSPITAL_BASED_OUTPATIENT_CLINIC_OR_DEPARTMENT_OTHER): Payer: BLUE CROSS/BLUE SHIELD | Admitting: Hematology

## 2015-01-26 VITALS — BP 146/75 | HR 94 | Temp 98.1°F | Resp 20 | Ht 65.0 in | Wt 245.6 lb

## 2015-01-26 DIAGNOSIS — Z803 Family history of malignant neoplasm of breast: Secondary | ICD-10-CM

## 2015-01-26 DIAGNOSIS — D242 Benign neoplasm of left breast: Secondary | ICD-10-CM

## 2015-01-26 NOTE — Progress Notes (Signed)
[name]  Telephone:([phone]) [phone] Fax:([phone]) Belk Note   Patient Care Team: Chevis Pretty, FNP as PCP - General (Nurse Practitioner) 01/26/2015  Referring physician: Dr. 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 female is here because of her history of left breast phyllodes tumor, and strong family history of breast cancer. She was referred by her breast surgeon to discuss her breast cancer prevention.  She had left breast lumpectomy in April 2009 for a benign breast lesion. She is very compliant with annual screening mammogram. She felt a small lump in the left breast in March 2016, underwent diagnostic mammogram which showed a 2.1 cm mass in the upper inner quadrant of Breast, and 2 groups of calcifications in the right breast. She underwent bilateral breast needle biopsy, the right breast biopsy was negative for malignancy, and the left breast mass biopsy showed biphasic tumor. She underwent left breast lumpectomy by Dr. Brantley Stage on 07/07/2014, and surgical path showed low-grade phalloides tumor, surgical margins were positive. She underwent a second breast surgery on 07/23/2014 for positive margins, and the pathology was negative for residual tumor. She tolerated surgery very well, recovered well without any issues.  She has strong family history of breast cancer (see below), and her sister was diagnosed with metastatic breast cancer recently. This prompted her to be referred to see a genetic counselor and me to discuss breast cancer risk reduction.   She feels well overall, denies any pain, dyspnea, GI symptoms or other new complaints. She has good appetite and eats well.  She is a half [place], never pregnant.   MEDICAL HISTORY:  Past Medical History  Diagnosis Date  . ANXIETY DEPRESSION   . SMOKER 2014      former  . TACHYCARDIA   . Palpitations   . Shortness of breath   . Hypertension   . Hyperlipidemia   . Wears glasses   . GERD (gastroesophageal reflux disease)     SURGICAL HISTORY: Past Surgical History  Procedure Laterality Date  . Breast mass excision Left 2009    left-papaloma  . Endometrial ablation  2010  . Colonoscopy    . Breast lumpectomy with radioactive seed localization Left 07/07/2014    Procedure: LEFT BREAST LUMPECTOMY WITH RADIOACTIVE SEED LOCALIZATION;  Surgeon: Erroll Luna, MD;  Location: [facility];  Service: General;  Laterality: Left;  . Re-excision of breast lumpectomy Left 07/22/2014    Procedure:  LEFT BREAST REEXCISION LUMPECTOMY;  Surgeon: Erroll Luna, MD;  Location: [facility];  Service: General;  Laterality: Left;   GYN HISTORY  Menarchal: 14 LMP: 50 after endometrial ablation   Contraceptive: no  HRT: no  G0P0:    SOCIAL HISTORY: Social History   Social History  . Marital Status: Married    Spouse Name: frank  . Number of Children: 4 A  . Years of Education: 12   Occupational History  . admin asst    Social History Main Topics  . Smoking status: Former Smoker    Types: Cigarettes    Quit date: 04/02/2012  . Smokeless tobacco: Never Used  . Alcohol Use: Yes     Comment: 1 per day  . Drug Use: No  . Sexual Activity: Yes    Birth Control/ Protection: None   Other Topics Concern  . Not on file   Social History  Narrative    FAMILY HISTORY: Family History  Problem Relation Age of Onset  . Breast cancer Maternal Grandmother     ALLERGIES:  has No Known Allergies.  MEDICATIONS:  Current Outpatient Prescriptions  Medication Sig Dispense Refill  . atorvastatin (LIPITOR) 40 MG tablet TAKE 1 TABLET (40 MG TOTAL) BY MOUTH DAILY. 30 tablet 5  . DULoxetine (CYMBALTA) 60 MG capsule TAKE 1 CAPSULE (60 MG TOTAL) BY MOUTH DAILY. 30 capsule 5  . lisinopril-hydrochlorothiazide (ZESTORETIC) 20-12.5  MG per tablet Take 1 tablet by mouth daily. 30 tablet 5  . metoprolol succinate (TOPROL-XL) 100 MG 24 hr tablet TAKE 1 TABLET (100 MG TOTAL) BY MOUTH DAILY. TAKE WITH OR IMMEDIATELY FOLLOWING A MEAL. 30 tablet 11  . omeprazole (PRILOSEC OTC) 20 MG tablet Take 1 tablet (20 mg total) by mouth daily. 30 tablet 5   No current facility-administered medications for this visit.    REVIEW OF SYSTEMS:   Constitutional: Denies fevers, chills or abnormal night sweats Eyes: Denies blurriness of vision, double vision or watery eyes Ears, nose, mouth, throat, and face: Denies mucositis or sore throat Respiratory: Denies cough, dyspnea or wheezes Cardiovascular: Denies palpitation, chest discomfort or lower extremity swelling Gastrointestinal:  Denies nausea, heartburn or change in bowel habits Skin: Denies abnormal skin rashes Lymphatics: Denies new lymphadenopathy or easy bruising Neurological:Denies numbness, tingling or new weaknesses Behavioral/Psych: Mood is stable, no new changes  All other systems were reviewed with the patient and are negative.  PHYSICAL EXAMINATION: ECOG PERFORMANCE STATUS: 0 - Asymptomatic  Filed Vitals:   01/26/15 1037  BP: 146/75  Pulse: 94  Temp: 98.1 F (36.7 C)  Resp: 20   Filed Weights   01/26/15 1037  Weight: 245 lb 9.6 oz (111.403 kg)    GENERAL:alert, no distress and comfortable SKIN: skin color, texture, turgor are normal, no rashes or significant lesions EYES: normal, conjunctiva are pink and non-injected, sclera clear OROPHARYNX:no exudate, no erythema and lips, buccal mucosa, and tongue normal  NECK: supple, thyroid normal size, non-tender, without nodularity LYMPH:  no palpable lymphadenopathy in the cervical, axillary or inguinal LUNGS: clear to auscultation and percussion with normal breathing effort HEART: regular rate & rhythm and no murmurs and no lower extremity edema ABDOMEN:abdomen soft, non-tender and normal bowel  sounds Musculoskeletal:no cyanosis of digits and no clubbing  PSYCH: alert & oriented x 3 with fluent speech NEURO: no focal motor/sensory deficits  LABORATORY DATA:  I have reviewed the data as listed Lab Results  Component Value Date   WBC 7.0 07/06/2014   HGB 13.3 07/06/2014   HCT 39.7 07/06/2014   MCV 98.0 07/06/2014   PLT 293 07/06/2014    Recent Labs  07/06/14 1045  NA 136  K 4.2  CL 99  CO2 25  GLUCOSE 104*  BUN 11  CREATININE 0.79  CALCIUM 9.3  GFRNONAA >90  GFRAA >90  PROT 7.3  ALBUMIN 4.1  AST 47*  ALT 49*  ALKPHOS 96  BILITOT 0.6   Pathology report  Diagnosis 07/07/2014 1. Breast, lumpectomy, Left - LOW GRADE PHYLLODES TUMOR, 3.4 CM IN GREATEST DIMENSION. - TUMOR INVOLVES LATERAL MARGIN. - TUMOR INVOLVES SUPERIOR MARGIN ON INITIAL LUMPECTOMY SPECIMEN (PLEASE CORRELATE WITH SPECIMEN #2 FOR FINAL MARGIN STATUS). - TUMOR IS LESS THAN 0.1 CM FROM POSTERIOR MARGIN - OTHER MARGINS ARE NEGATIVE. 2. Breast, excision, Left additional superior margin - BENIGN BREAST PARENCHYMA WITH FIBROCYSTIC CHANGES. - NO ATYPIA OR TUMOR SEEN. Microscopic Comment 1. Dr. Lyndon Code has seen the tumor  in consultation with agreement that the findings represent a low grade phyllodes tumor.  Diagnosis 07/22/2014  1. Breast, excision, left anterior margin - BREAST PARENCHYMA WITH FIBROCYSTIC CHANGES AND FAT NECROSIS. - NO ATYPIA OR TUMOR SEEN. 2. Breast, excision, left inferior margin - BREAST PARENCHYMA WITH FIBROCYSTIC CHANGES AND FAT NECROSIS. - NO ATYPIA OR TUMOR SEEN. 3. Breast, excision, left lateral margin - BREAST PARENCHYMA WITH FIBROCYSTIC CHANGES AND FOCAL USUAL DUCTAL HYPERPLASIA. - FAT NECROSIS PRESENT. - NO ATYPIA OR TUMOR SEEN. 4. Breast, excision, left medial margin - BREAST PARENCHYMA WITH FIBROCYSTIC CHANGES AND FAT NECROSIS. - NO ATYPIA OR TUMOR SEEN. 5. Breast, excision, left posterior margin - BREAST PARENCHYMA WITH FIBROCYSTIC CHANGES AND FAT NECROSIS. -  NO ATYPIA OR TUMOR SEEN. 6. Breast, excision, left superior margin - BREAST PARENCHYMA WITH FIBROCYSTIC CHANGES AND FAT NECROSIS. - NO ATYPIA OR TUMOR SEEN. Willeen Niece MD Pathologist, Electronic Signature (Case signed 07/27/2014) Specimen Gross  RADIOGRAPHIC STUDIES: I have personally reviewed the radiological images as listed and agreed with the findings in the report. Mm Digital Diagnostic Unilat R  01/01/2015  CLINICAL DATA:  Right breast calcifications biopsied in March of 2016 with benign pathology result. Six-month follow-up recommended for additional probably benign calcifications within the inferior right breast. EXAM: DIGITAL DIAGNOSTIC RIGHT MAMMOGRAM WITH CAD COMPARISON:  Previous exam(s). ACR Breast Density Category c: The breast tissue is heterogeneously dense, which may obscure small masses. FINDINGS: Right breast CC and MLO views were obtained, with additional magnification views of the area of probably benign calcifications previously described within the inferior right breast. The grouped calcifications, predominantly punctate, within the inferior right breast are unchanged compared to the previous diagnostic mammogram of 05/25/2014. There are no new dominant masses, suspicious calcifications or secondary signs of malignancy identified in the right breast. Mammographic images were processed with CAD. IMPRESSION: Stable probably benign calcifications, predominantly punctate, within the inferior right breast. Recommend additional six-month follow-up right breast diagnostic mammogram to ensure continued stability. RECOMMENDATION: Follow-up right breast diagnostic mammogram, with magnification views, in 6 months. This will be performed as a bilateral diagnostic mammogram in conjunction with patient's routine left breast screening mammogram schedule. I have discussed the findings and recommendations with the patient. Results were also provided in writing at the conclusion of the visit. If  applicable, a reminder letter will be sent to the patient regarding the next appointment. BI-RADS CATEGORY  3: Probably benign finding(s) - short interval follow-up suggested. Electronically Signed   By: Franki Cabot M.D.   On: 01/01/2015 10:53    ASSESSMENT & PLAN:  54 year old woman, likely postmenopausal  1. Left breast low-grade phyllodes tumor -She has had surgical resection with wide negative margins after re-excision.  -We discussed this is a low-grade phyllodes tumor, likely benign, the risk of recurrence all metastasis is very low. -She does not need adjuvant chemotherapy or radiation. No role for antiestrogen therapy also. -We discussed surveillance, which include physical exam and chest x-ray every 6 months for 2 years, then annually.  2. Strong family history of breast cancer, high risk for breast cancer  -She has multiple family members had breast cancer, including one cousin who has positive BRCA mutation -She has been referred to see our genetic counselor next week. She will likely undergo germline mutation test to rule out inheritable breast ovarian syndrome. -Her risk of breast cancer will be heavily dependent on her genetic test results. And I would like to see her if she has positive gene mutations. -She also has  multiple other risk factors for breast cancer, such as obesity, no history of pregnancy, multiple breast biopsy and benign lesions, etc. I used the NSABP risk model and calculated her risk of breast cancer:  5.2% in the next 5 years, and 37.8% in her lifetime. This is considered high-risk -If her genetic test is negative, I recommend her to have annual mammogram and annual breast MRI, due to her dense breast tissue. I encouraged her to have self exam and see [place] every 6 months for physical exam including breast exam. -I strongly recommend her to have healthy diet, and exercise regularly, try to lose some weight. She is motivated and interested.  Plan -I'll wait and  see what her genetic test shows. If  positive, I'll see her back to discuss further management. If negative, I'll see her back in 6 months, and obtain a breast MRI in late Dec 2016.    All questions were answered. The patient knows to call the clinic with any problems, questions or concerns. I spent 55 minutes counseling the patient face to face. The total time spent in the appointment was 60 minutes and more than 50% was on counseling.     Truitt Merle, MD 01/26/2015 11:35 AM

## 2015-01-27 ENCOUNTER — Encounter: Payer: Self-pay | Admitting: Cardiovascular Disease

## 2015-01-27 ENCOUNTER — Ambulatory Visit (INDEPENDENT_AMBULATORY_CARE_PROVIDER_SITE_OTHER): Payer: BLUE CROSS/BLUE SHIELD | Admitting: Cardiovascular Disease

## 2015-01-27 VITALS — BP 140/90 | HR 93 | Ht 65.0 in | Wt 245.4 lb

## 2015-01-27 DIAGNOSIS — R000 Tachycardia, unspecified: Secondary | ICD-10-CM

## 2015-01-27 MED ORDER — METOPROLOL SUCCINATE ER 100 MG PO TB24: ORAL_TABLET | ORAL | Status: DC

## 2015-01-27 NOTE — Patient Instructions (Signed)
Medication Instructions:  Your physician recommends that you continue on your current medications as directed. Please refer to the Current Medication list given to you today.   Labwork: NONE  Testing/Procedures: NONE  Follow-Up: Your physician recommends that you schedule a follow-up appointment in: [place]  Any Other Special Instructions Will Be Listed Below (If Applicable).     If you need a refill on your cardiac medications before your next appointment, please call your pharmacy.

## 2015-01-28 ENCOUNTER — Other Ambulatory Visit: Payer: Self-pay | Admitting: Nurse Practitioner

## 2015-01-28 NOTE — Telephone Encounter (Signed)
Last seen 08/07/14  MMM

## 2015-02-04 ENCOUNTER — Ambulatory Visit (HOSPITAL_BASED_OUTPATIENT_CLINIC_OR_DEPARTMENT_OTHER): Payer: BLUE CROSS/BLUE SHIELD | Admitting: Genetic Counselor

## 2015-02-04 ENCOUNTER — Other Ambulatory Visit: Payer: BLUE CROSS/BLUE SHIELD

## 2015-02-04 ENCOUNTER — Encounter: Payer: Self-pay | Admitting: Genetic Counselor

## 2015-02-04 DIAGNOSIS — Z800 Family history of malignant neoplasm of digestive organs: Secondary | ICD-10-CM

## 2015-02-04 DIAGNOSIS — D242 Benign neoplasm of left breast: Secondary | ICD-10-CM

## 2015-02-04 DIAGNOSIS — Z789 Other specified health status: Secondary | ICD-10-CM

## 2015-02-04 DIAGNOSIS — Z8051 Family history of malignant neoplasm of kidney: Secondary | ICD-10-CM

## 2015-02-04 DIAGNOSIS — Z801 Family history of malignant neoplasm of trachea, bronchus and lung: Secondary | ICD-10-CM

## 2015-02-04 DIAGNOSIS — IMO0001 Reserved for inherently not codable concepts without codable children: Secondary | ICD-10-CM

## 2015-02-04 DIAGNOSIS — Z803 Family history of malignant neoplasm of breast: Secondary | ICD-10-CM

## 2015-02-04 NOTE — Progress Notes (Signed)
REFERRING PROVIDER: Truitt Merle, MD  OTHER PROVIDER(S): Erroll Luna, MD  PRIMARY PROVIDER:  Chevis Pretty, FNP  PRIMARY REASON FOR VISIT:  1. Benign phyllodes tumor of left breast   2. Family history of breast cancer in female   3. Family history of colon cancer   4. Family history of kidney cancer   5. Family history of lung cancer   6. Ashkenazi Jewish ancestry      HISTORY OF PRESENT ILLNESS:   Amanda Kelly, a 54 y.o. female, was seen for a [facility] cancer genetics consultation at the request of Dr. Burr Medico due to a family history of reportedly pathogenic BRCA gene mutation and family history of breast and other cancers.   Amanda Kelly presents to clinic today to discuss the possibility of a hereditary predisposition to cancer, genetic testing, and to further clarify her future cancer risks, as well as potential cancer risks for family members.    Amanda Kelly is a 54 y.o. female with no personal history of cancer.  In April 2009, Amanda Kelly was diagnosed with a papilloma of the left breast, for which she underwent left lumpectomy and re-excision.  More recently in March 2016, she was diagnosed with benign phyllodes tumor of the left breast and right breast fibrocystic changes and calcifications.  Dr. Burr Medico is recommending breast MRI due to dense breast tissue and the personal history.  Based on the family history, genetic testing will help inform further cancer screening management.  CANCER HISTORY:   No history exists.     HORMONAL RISK FACTORS:  Menarche was at age 52.  First live birth at age - no biological children; has four adopted children.  OCP use for approximately 20 years.  Ovaries intact: yes.  Hysterectomy: no.  Menopausal status: postmenopausal.  HRT use: 0 years Infertility: previous issues with infertility, but no hormones used Colonoscopy: yes; normal. Mammogram within the last year: yes. Number of breast biopsies: maybe 8 biopsies total between both  breasts. Up to date with pelvic exams:  yes, every 5 years. Any excessive radiation exposure in the past:  no  Past Medical History  Diagnosis Date  . ANXIETY DEPRESSION   . SMOKER 2014    former  . TACHYCARDIA   . Palpitations   . Shortness of breath   . Hypertension   . Hyperlipidemia   . Wears glasses   . GERD (gastroesophageal reflux disease)     Past Surgical History  Procedure Laterality Date  . Breast mass excision Left 2009    left-papaloma  . Endometrial ablation  2010  . Colonoscopy    . Breast lumpectomy with radioactive seed localization Left 07/07/2014    Procedure: LEFT BREAST LUMPECTOMY WITH RADIOACTIVE SEED LOCALIZATION;  Surgeon: Erroll Luna, MD;  Location: [facility];  Service: General;  Laterality: Left;  . Re-excision of breast lumpectomy Left 07/22/2014    Procedure:  LEFT BREAST REEXCISION LUMPECTOMY;  Surgeon: Erroll Luna, MD;  Location: [facility];  Service: General;  Laterality: Left;    Social History   Social History  . Marital Status: Married    Spouse Name: frank  . Number of Children: 4 A  . Years of Education: 12   Occupational History  . admin asst    Social History Main Topics  . Smoking status: Former Smoker -- 1.00 packs/day for 20 years    Types: Cigarettes    Quit date: 03/27/2008  . Smokeless tobacco: Never Used  . Alcohol Use:  1.2 oz/week    2 Glasses of wine per week     Comment: 1-2 a week   . Drug Use: No  . Sexual Activity: Yes    Birth Control/ Protection: None   Other Topics Concern  . Not on file   Social History Narrative     FAMILY HISTORY:  We obtained a detailed, 4-generation family history.  Significant diagnoses are listed below: Family History  Problem Relation Age of Onset  . Breast cancer Maternal Grandmother 60  . Lung cancer Father     dx. 52s; smoker; no contact w/ father  . Breast cancer Sister     dx. 39-53; metastatic  . Memory loss Sister     short-term   . Breast cancer Maternal Aunt 60  . Cancer Paternal Uncle     kidney cancer   . Breast cancer Cousin 78    bilateral w/ metastasis  . Breast cancer Cousin     unknown age of dx  . Breast cancer Cousin 85    mastectomy  . Breast cancer Cousin 41    mastectomy  . BRCA 1/2 Cousin     testing in 2015 in [place]  . Cancer Paternal Uncle     colon cancer  . Kidney cancer Maternal Uncle     unknown age dx  . Colon cancer Maternal Uncle     unknown age dx.  . Stroke Maternal Uncle   . Breast cancer Other     Patient's maternal ancestors are of [place] and [place] descent, and paternal ancestors are of [place] descent. There is likely Ashkenazi Jewish ancestry based on the patient's report of European and Jewish maternal family ancestry. There is no known consanguinity.  GENETIC COUNSELING ASSESSMENT: Amanda Kelly is a 54 y.o. female with a family history of BRCA+ genetic testing and family history of breast cancers which is somewhat suggestive of hereditary breast and ovarian cancer syndrome and predisposition to cancer. We, therefore, discussed and recommended the following at today's visit.   DISCUSSION: We reviewed the characteristics, features and inheritance patterns of hereditary cancer syndromes. We also discussed genetic testing, including the appropriate family members to test, the process of testing, insurance coverage and turn-around-time for results. We discussed the implications of a negative, positive and/or variant of uncertain significant result. We discussed that ideally, we would like to have a copy of the positive test result from Amanda Kelly maternal first-cousin, once-removed.  It would also be more informative to verify that this mutation is also present in other maternal relatives more closely related to Amanda Kelly.  We discussed that testing for Amanda Kelly mother will likely still be recommended in the even that Amanda Kelly herself tests negative.  She would still like to  proceed with genetic testing, especially considering her personal history of abnormal mammograms and lumpectomies.  We recommended Amanda Kelly pursue genetic testing for the 20-gene Breast/Ovarian Cancer Panel which is what we would typically offer her based on her meeting criteria and since we do not have a test report verifying the family history of the BRCA mutation.   Based on Amanda Kelly's family history of cancer, she meets medical criteria for genetic testing. Despite that she meets criteria, she may still have an out of pocket cost. We discussed that if her out of pocket cost for testing is over $100, the laboratory will call and confirm whether she wants to proceed with testing.  If the out of pocket cost of testing is  less than $100 she will be billed by the genetic testing laboratory.   Based on the patient's personal and family history, a statistical model ([place])  and literature data were used to estimate her lifetime risk of developing breast cancer. These estimate her lifetime risk of developing breast cancer to be approximately 52.8%. This estimation does not take into account any genetic testing results (it also does not include the genetic testing results of Amanda Kelly maternal first cousin, once-removed).  The patient's lifetime breast cancer risk is a preliminary estimate based on available information using one of several models endorsed by the [place] (ACS). The ACS recommends consideration of breast MRI screening as an adjunct to mammography for patients at high risk (defined as 20% or greater lifetime risk). A more detailed breast cancer risk assessment can be considered, if clinically indicated.   PLAN: After considering the risks, benefits, and limitations, Amanda Kelly  provided informed consent to pursue genetic testing and the blood sample was sent to [facility] for analysis of the 20-gene Breast/Ovarian Cancer Panel test. Results should  be available within approximately 2-3 weeks' time, at which point they will be disclosed by telephone to Amanda Kelly, as will any additional recommendations warranted by these results. Amanda Kelly will receive a summary of her genetic counseling visit and a copy of her results once available. This information will also be available in Epic. We encouraged Amanda Kelly to remain in contact with cancer genetics annually so that we can continuously update the family history and inform her of any changes in cancer genetics and testing that may be of benefit for her family. Amanda Kelly questions were answered to her satisfaction today. Our contact information was provided should additional questions or concerns arise.  Thank you for the referral and allowing [place] to share in the care of your patient.   Jeanine Luz, MS Genetic Counselor [email or web address] Phone: [phone]  The patient was seen for a total of 60 minutes in face-to-face genetic counseling.  This patient was discussed with Drs. Magrinat, Lindi Adie and/or Burr Medico who agrees with the above.    _______________________________________________________________________ For Office Staff:  Number of people involved in session: 1 Was an Intern/ student involved with case: no

## 2015-02-22 ENCOUNTER — Ambulatory Visit: Payer: Self-pay | Admitting: Genetic Counselor

## 2015-02-22 ENCOUNTER — Telehealth: Payer: Self-pay | Admitting: Genetic Counselor

## 2015-02-22 DIAGNOSIS — Z1379 Encounter for other screening for genetic and chromosomal anomalies: Secondary | ICD-10-CM

## 2015-02-22 NOTE — Telephone Encounter (Signed)
Discussed with Amanda Kelly that her genetic test results were negative for pathogenic mutations within any of 20 genes that would cause her to be at increased risk for breast, ovarian, or other related cancers.  Additionally, no uncertain changes were found.  Discussed that it would still be very helpful if [name] could get a copy of her maternal cousin (who had positive BRCA testing)'s result.  Additional family maternal family members should also have genetic testing to determine whether this known mutation in the cousin is coming from this family and not from the cousin's father's family.  In this case, we would call this a true negative result for [name] and it would be very reassuring for [place].  We discussed that [name]'s sister would still meet criteria for genetic counseling and testing, but that, perhaps, the best person to test next in the immediate family is [name]'s mother.  If she did not inherit the known familial mutation, then she would not have had anything to pass on to any of her children, and this will be most informative for all of [name]'s siblings' genetic risks.  [name] reports that many of these family members live outside of [place], so we discussed that they can [place] the [employer] website, [email or web address], to lookup cancer genetic counselors in their area.  She will pass this information along, and I will send her a copy of her test results, her pedigree, and a note discussing what we have talked about through secure email--this way she can share this with her family members as well.  This result does not change anything regarding [name]'s future cancer screening.  She should continue to follow the recommendations of her providers.  Women in the family should begin annual mammogram screening at the age of 44 or earlier (as indicated by positive genetic mutation).  [name] is welcome to call me with any questions.

## 2015-02-22 NOTE — Progress Notes (Signed)
GENETIC TEST RESULT  HPI: Ms. 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 Please refer to our prior cancer genetics clinic note from February 04, 2015 for more information regarding Ms. Echeverria's medical, social and family histories, and our assessment and recommendations, at the time. Ms. Ahlberg recent genetic test results were disclosed to her, as were recommendations warranted by these results. These results and recommendations are discussed in more detail below.  GENETIC TEST RESULTS: At the time of Ms. Rauda's visit on 02/04/15, we recommended she pursue genetic testing of the 20-gene Breast/Ovarian Cancer Panel through [facility].  The Breast/Ovarian Cancer Panel offered by [facility] Hope Pigeon, MD) includes sequencing and deletion/duplication analysis for the following 19 genes:  ATM, BARD1, BRCA1, BRCA2, BRIP1, CDH1, CHEK2, FANCC, MLH1, MSH2, MSH6, NBN, PALB2, PMS2, PTEN, RAD51C, RAD51D, TP53, and XRCC2.  This panel also includes deletion/duplication analysis (without sequencing) for one gene, EPCAM.  Those results are now back, the report date for which is February 19, 2015.  Genetic testing was normal, and did not reveal a deleterious mutation in these genes.  Additionally, no variants of uncertain significance (VUSes) were found. The test report will be scanned into EPIC and will be located under the Results Review tab in the Pathology>Molecular Pathology section.   We discussed with Ms. Strzelecki that since the current genetic testing is not perfect, it is possible there may be a gene mutation in one of these genes that current testing cannot detect, but that chance is small. We also discussed, that  it is possible that another gene that has not yet been discovered, or that we have not yet tested, is responsible for the cancer diagnoses in the family, and it is, therefore, important to remain in touch with cancer genetics in the future so that we can continue to offer Ms. Chuong the most up to date genetic testing.   CANCER SCREENING RECOMMENDATIONS: This normal result is reassuring and indicates that Ms. Philley does not likely have an increased risk of cancer due to a mutation in one of these genes.  It would be very helpful if we could ensure that the maternal family history of breast cancer is, in fact, due to the BRCA mutation reported in Ms. Wohl's maternal first cousin, once-removed.  More of Ms. Kratz's affected maternal relatives should have genetic testing to verify that this is the case.  This would be even more reassuring for Ms. Haran, since, at that point, we would be able to say that her test result is a true negative.  Still, Ms. Luck has a personal history of abnormal breast findings and this raises her risks for future breast cancers somewhat.  We, therefore, recommended  Ms. Rincon continue to follow the cancer screening guidelines provided by her primary healthcare providers.   RECOMMENDATIONS FOR FAMILY MEMBERS: Women in this family might be at some increased risk of developing cancer, over the general population risk, simply due to the family history of cancer. We recommended women in this family have a yearly mammogram beginning at age 65, or 66 years younger than the earliest onset of cancer, an an annual clinical breast exam, and perform monthly breast self-exams. Thus, women in the family should likely begin mammogram screening at the age of 87  or earlier based on positive genetic test result.  Women in this family should also have a gynecological exam as recommended by their primary provider. All family members should have a colonoscopy by age 12.  Based on Ms.  Martorelli's family history and the known BRCA mutation in a maternal first cousin, once-removed, we recommended her other affected maternal relatives (maternal first cousins), have genetic counseling and testing.  Ms. Mogle mother would then be the best person to undergo genetic testing to rule out inheritance of a familial mutation, which would in turn, give [place] the most information about Ms. Hincapie's siblings' hereditary cancer risks. Ms. Marsan will let us know if we can be of any assistance in coordinating genetic counseling and/or testing for these family members.  We discussed that many of these relatives live in [place] and [place]; they can use the [employer] ([employer]) to look-up a cancer Dietitian in their local areas.  FOLLOW-UP: Lastly, we discussed with Ms. Bin that cancer genetics is a rapidly advancing field and it is possible that new genetic tests will be appropriate for her and/or her family members in the future. We encouraged her to remain in contact with cancer genetics on an annual basis so we can update her personal and family histories and let her know of advances in cancer genetics that may benefit this family.   Ms. Leamer would like a copy of her test results securely emailed to her, and we are happy to facilitate that.  Our contact number was provided. Ms. Rosier questions were answered to her satisfaction, and she knows she is welcome to call us at anytime with additional questions or concerns.    Jeanine Luz, MS Genetic Counselor [email or web address] Phone: [phone]

## 2015-02-26 ENCOUNTER — Encounter: Payer: Self-pay | Admitting: Genetic Counselor

## 2015-03-02 ENCOUNTER — Other Ambulatory Visit: Payer: Self-pay | Admitting: Nurse Practitioner

## 2015-03-03 NOTE — Telephone Encounter (Signed)
Last refill without being seen 

## 2015-03-03 NOTE — Telephone Encounter (Signed)
Advised pt she ntbs for further refills. Pt voiced understanding and will CB to schedule.

## 2015-03-03 NOTE — Telephone Encounter (Signed)
Last seen 08/07/14  MMM 

## 2015-03-06 ENCOUNTER — Other Ambulatory Visit: Payer: Self-pay | Admitting: Nurse Practitioner

## 2015-03-08 NOTE — Telephone Encounter (Signed)
Last seen 08/07/14  MMM 

## 2015-03-11 ENCOUNTER — Other Ambulatory Visit: Payer: Self-pay | Admitting: Nurse Practitioner

## 2015-03-17 ENCOUNTER — Encounter: Payer: Self-pay | Admitting: Nurse Practitioner

## 2015-03-19 ENCOUNTER — Ambulatory Visit (INDEPENDENT_AMBULATORY_CARE_PROVIDER_SITE_OTHER): Payer: BLUE CROSS/BLUE SHIELD | Admitting: Nurse Practitioner

## 2015-03-19 ENCOUNTER — Encounter: Payer: Self-pay | Admitting: Nurse Practitioner

## 2015-03-19 VITALS — BP 138/84 | HR 100 | Temp 97.3°F | Ht 65.0 in | Wt 247.0 lb

## 2015-03-19 DIAGNOSIS — I10 Essential (primary) hypertension: Secondary | ICD-10-CM

## 2015-03-19 DIAGNOSIS — Z1159 Encounter for screening for other viral diseases: Secondary | ICD-10-CM

## 2015-03-19 DIAGNOSIS — Z1212 Encounter for screening for malignant neoplasm of rectum: Secondary | ICD-10-CM

## 2015-03-19 DIAGNOSIS — F341 Dysthymic disorder: Secondary | ICD-10-CM

## 2015-03-19 DIAGNOSIS — K219 Gastro-esophageal reflux disease without esophagitis: Secondary | ICD-10-CM

## 2015-03-19 DIAGNOSIS — E785 Hyperlipidemia, unspecified: Secondary | ICD-10-CM

## 2015-03-19 DIAGNOSIS — R000 Tachycardia, unspecified: Secondary | ICD-10-CM | POA: Diagnosis not present

## 2015-03-19 MED ORDER — METOPROLOL SUCCINATE ER 100 MG PO TB24: ORAL_TABLET | ORAL | Status: DC

## 2015-03-19 MED ORDER — DULOXETINE HCL 60 MG PO CPEP: ORAL_CAPSULE | ORAL | Status: DC

## 2015-03-19 MED ORDER — ATORVASTATIN CALCIUM 40 MG PO TABS: ORAL_TABLET | ORAL | Status: DC

## 2015-03-19 MED ORDER — LISINOPRIL-HYDROCHLOROTHIAZIDE 20-12.5 MG PO TABS: 1.0000 | ORAL_TABLET | Freq: Every day | ORAL | Status: DC

## 2015-03-19 MED ORDER — OMEPRAZOLE 20 MG PO CPDR: DELAYED_RELEASE_CAPSULE | ORAL | Status: DC

## 2015-03-19 NOTE — Progress Notes (Signed)
Subjective:    Patient ID: Amanda Kelly, female    DOB: October 13, 1960, 54 y.o.   MRN: [id number]   Patient here today for follow up of chronic medical problems.   Hypertension This is a chronic problem. The current episode started more than 1 year ago. The problem is unchanged. The problem is uncontrolled. Pertinent negatives include no chest pain, headaches, palpitations or shortness of breath. Risk factors for coronary artery disease include dyslipidemia, obesity and post-menopausal state. Past treatments include beta blockers. The current treatment provides mild improvement. Compliance problems include diet and exercise.   Hyperlipidemia This is a chronic problem. The current episode started more than 1 year ago. Recent lipid tests were reviewed and are variable. Pertinent negatives include no chest pain or shortness of breath. Current antihyperlipidemic treatment includes statins. The current treatment provides moderate improvement of lipids. Compliance problems include adherence to diet and adherence to exercise.  Risk factors for coronary artery disease include dyslipidemia, hypertension, post-menopausal and obesity.  Depression/GAD Cymbalta-works great- can really tell a difference if she misses a dose. Cries a lot lots of worrying Breast cancer recurrent Has had lumpectomy with clear margins- Is not going to see oncologist- they are just going to watch for now. Tachycardia Metoprolol helps keep heart rate down.    Review of Systems  Constitutional: Negative.   HENT: Negative.   Respiratory: Negative for shortness of breath.   Cardiovascular: Negative for chest pain and palpitations.  Genitourinary: Negative.   Neurological: Negative.  Negative for headaches.  Psychiatric/Behavioral: Negative.   All other systems reviewed and are negative.      Objective:   Physical Exam  Constitutional: She is oriented to person, place, and time. She appears well-developed and  well-nourished.  HENT:  Nose: Nose normal.  Mouth/Throat: Oropharynx is clear and moist.  Eyes: EOM are normal.  Neck: Trachea normal, normal range of motion and full passive range of motion without pain. Neck supple. No JVD present. Carotid bruit is not present. No thyromegaly present.  Cardiovascular: Normal rate, regular rhythm, normal heart sounds and intact distal pulses.  Exam reveals no gallop and no friction rub.   No murmur heard. Pulmonary/Chest: Effort normal and breath sounds normal.  Abdominal: Soft. Bowel sounds are normal. She exhibits no distension and no mass. There is tenderness (slight epigastric pain).  Musculoskeletal: Normal range of motion.  Lymphadenopathy:    She has no cervical adenopathy.  Neurological: She is alert and oriented to person, place, and time. She has normal reflexes.  Skin: Skin is warm and dry.  Psychiatric: She has a normal mood and affect. Her behavior is normal. Judgment and thought content normal.   BP 138/84 mmHg  Pulse 100  Temp(Src) 97.3 F (36.3 C) (Oral)  Ht 5' 5"  (1.651 m)  Wt 247 lb (112.038 kg)  BMI 41.10 kg/m2          Assessment & Plan:   1. Essential hypertension Do not add saltt o diet - lisinopril-hydrochlorothiazide (PRINZIDE,ZESTORETIC) 20-12.5 MG tablet; Take 1 tablet by mouth daily.  Dispense: 30 tablet; Refill: 5 - CMP14+EGFR  2. ANXIETY DEPRESSION Stress management - DULoxetine (CYMBALTA) 60 MG capsule; TAKE 1 CAPSULE (60 MG TOTAL) BY MOUTH DAILY.  Dispense: 30 capsule; Refill: 5  3. Hyperlipidemia with target LDL less than 100 Low fat diet - atorvastatin (LIPITOR) 40 MG tablet; TAKE 1 TABLET (40 MG TOTAL) BY MOUTH DAILY.  Dispense: 30 tablet; Refill: 5 - Lipid panel  4. Morbid obesity, unspecified  obesity type ([place]) Discussed diet and exercise for person with BMI >25 Will recheck weight in 3-6 months   5. Tachycardia Avoid caffeine - metoprolol succinate (TOPROL-XL) 100 MG 24 hr tablet; TAKE 1  TABLET (100 MG TOTAL) BY MOUTH DAILY. TAKE WITH OR IMMEDIATELY FOLLOWING A MEAL.  Dispense: 90 tablet; Refill: 3  6. Gastroesophageal reflux disease, esophagitis presence not specified Avoid spicy foods Do not eat 2 hours prior to bedtime - omeprazole (PRILOSEC) 20 MG capsule; TAKE 1 TABLET (20 MG TOTAL) BY MOUTH DAILY.  Dispense: 30 capsule; Refill: 5  7. Screening for malignant neoplasm of the rectum - Fecal occult blood, imunochemical; Future  8. Need for hepatitis C screening test - HCV Antibody RFX to Quant PCR    Labs pending Health maintenance reviewed Diet and exercise encouraged Continue all meds Follow up  In 6 month   Webb, FNP

## 2015-03-19 NOTE — Patient Instructions (Signed)
Exercising to Stay Healthy Exercising regularly is important. It has many health benefits, such as:  Improving your overall fitness, flexibility, and endurance.  Increasing your bone density.  Helping with weight control.  Decreasing your body fat.  Increasing your muscle strength.  Reducing stress and tension.  Improving your overall health. In order to become healthy and stay healthy, it is recommended that you do moderate-intensity and vigorous-intensity exercise. You can tell that you are exercising at a moderate intensity if you have a higher heart rate and faster breathing, but you are still able to hold a conversation. You can tell that you are exercising at a vigorous intensity if you are breathing much harder and faster and cannot hold a conversation while exercising. HOW OFTEN SHOULD I EXERCISE? Choose an activity that you enjoy and set realistic goals. Your health care provider can help you to make an activity plan that works for you. Exercise regularly as directed by your health care provider. This may include:   Doing resistance training twice each week, such as:  Push-ups.  Sit-ups.  Lifting weights.  Using resistance bands.  Doing a given intensity of exercise for a given amount of time. Choose from these options:  150 minutes of moderate-intensity exercise every week.  75 minutes of vigorous-intensity exercise every week.  A mix of moderate-intensity and vigorous-intensity exercise every week. Children, pregnant women, people who are out of shape, people who are overweight, and older adults may need to consult a health care provider for individual recommendations. If you have any sort of medical condition, be sure to consult your health care provider before starting a new exercise program.  WHAT ARE SOME EXERCISE IDEAS? Some moderate-intensity exercise ideas include:   Walking at a rate of 1 mile in 15  minutes.  Biking.  Hiking.  Golfing.  Dancing. Some vigorous-intensity exercise ideas include:   Walking at a rate of at least 4.5 miles per hour.  Jogging or running at a rate of 5 miles per hour.  Biking at a rate of at least 10 miles per hour.  Lap swimming.  Roller-skating or in-line skating.  Cross-country skiing.  Vigorous competitive sports, such as football, basketball, and soccer.  Jumping rope.  Aerobic dancing. WHAT ARE SOME EVERYDAY ACTIVITIES THAT CAN HELP ME TO GET EXERCISE?  Yard work, such as:  Pushing a lawn mower.  Raking and bagging leaves.  Washing and waxing your car.  Pushing a stroller.  Shoveling snow.  Gardening.  Washing windows or floors. HOW CAN I BE MORE ACTIVE IN MY DAY-TO-DAY ACTIVITIES?  Use the stairs instead of the elevator.  Take a walk during your lunch break.  If you drive, park your car farther away from work or school.  If you take public transportation, get off one stop early and walk the rest of the way.  Make all of your phone calls while standing up and walking around.  Get up, stretch, and walk around every 30 minutes throughout the day. WHAT GUIDELINES SHOULD I FOLLOW WHILE EXERCISING?  Do not exercise so much that you hurt yourself, feel dizzy, or get very short of breath.  Consult your health care provider before starting a new exercise program.  Wear comfortable clothes and shoes with good support.  Drink plenty of water while you exercise to prevent dehydration or heat stroke. Body water is lost during exercise and must be replaced.  Work out until you breathe faster and your heart beats faster.   This information   is not intended to replace advice given to you by your health care provider. Make sure you discuss any questions you have with your health care provider.   Document Released: 04/15/2010 Document Revised: 04/03/2014 Document Reviewed: 08/14/2013 Elsevier Interactive Patient Education  2016 Elsevier Inc.  

## 2015-03-24 LAB — CMP14+EGFR
ALK PHOS: 106 IU/L (ref 39–117)
ALT: 53 IU/L — ABNORMAL HIGH (ref 0–32)
AST: 52 IU/L — AB (ref 0–40)
Albumin/Globulin Ratio: 2 (ref 1.1–2.5)
Albumin: 4.6 g/dL (ref 3.5–5.5)
BUN/Creatinine Ratio: 21 (ref 9–23)
BUN: 15 mg/dL (ref 6–24)
Bilirubin Total: 0.2 mg/dL (ref 0.0–1.2)
CHLORIDE: 92 mmol/L — AB (ref 96–106)
CO2: 19 mmol/L (ref 18–29)
CREATININE: 0.7 mg/dL (ref 0.57–1.00)
Calcium: 9.9 mg/dL (ref 8.7–10.2)
GFR calc Af Amer: 115 mL/min/{1.73_m2} (ref >59)
GFR calc non Af Amer: 100 mL/min/{1.73_m2} (ref >59)
GLUCOSE: 93 mg/dL (ref 65–99)
Globulin, Total: 2.3 g/dL (ref 1.5–4.5)
Potassium: 4.2 mmol/L (ref 3.5–5.2)
SODIUM: 135 mmol/L (ref 134–144)
Total Protein: 6.9 g/dL (ref 6.0–8.5)

## 2015-03-24 LAB — LIPID PANEL
CHOLESTEROL TOTAL: 182 mg/dL (ref 100–199)
Chol/HDL Ratio: 3.2 ratio units (ref 0.0–4.4)
HDL: 57 mg/dL (ref >39)
TRIGLYCERIDES: 459 mg/dL — AB (ref 0–149)

## 2015-03-24 LAB — HCV AB W REFLEX TO QUANT PCR: HCV Ab: <0.1 s/co ratio (ref 0.0–0.9)

## 2015-04-23 ENCOUNTER — Encounter (HOSPITAL_COMMUNITY): Payer: Self-pay

## 2015-06-01 ENCOUNTER — Other Ambulatory Visit: Payer: Self-pay | Admitting: Surgery

## 2015-06-01 DIAGNOSIS — R921 Mammographic calcification found on diagnostic imaging of breast: Secondary | ICD-10-CM

## 2015-06-29 ENCOUNTER — Ambulatory Visit
Admission: RE | Admit: 2015-06-29 | Discharge: 2015-06-29 | Disposition: A | Discharge status: Home / Self Care | Payer: BLUE CROSS/BLUE SHIELD | Source: Ambulatory Visit | Attending: Surgery | Admitting: Surgery

## 2015-06-29 DIAGNOSIS — R921 Mammographic calcification found on diagnostic imaging of breast: Secondary | ICD-10-CM

## 2015-07-05 ENCOUNTER — Encounter: Payer: Self-pay | Admitting: Nurse Practitioner

## 2015-07-05 ENCOUNTER — Other Ambulatory Visit: Payer: Self-pay | Admitting: Nurse Practitioner

## 2015-08-12 ENCOUNTER — Encounter: Payer: Self-pay | Admitting: Nurse Practitioner

## 2015-08-12 ENCOUNTER — Ambulatory Visit (INDEPENDENT_AMBULATORY_CARE_PROVIDER_SITE_OTHER): Payer: BLUE CROSS/BLUE SHIELD | Admitting: Nurse Practitioner

## 2015-08-12 VITALS — BP 152/88 | HR 101 | Temp 98.3°F | Ht 65.0 in | Wt 247.0 lb

## 2015-08-12 DIAGNOSIS — F341 Dysthymic disorder: Secondary | ICD-10-CM

## 2015-08-12 MED ORDER — ESCITALOPRAM OXALATE 20 MG PO TABS: 20.0000 mg | ORAL_TABLET | Freq: Every day | ORAL | Status: DC

## 2015-08-12 MED ORDER — SERTRALINE HCL 100 MG PO TABS
100.0000 mg | ORAL_TABLET | Freq: Every day | ORAL | Status: DC
Start: 2015-08-12 — End: 2015-10-08

## 2015-08-12 NOTE — Progress Notes (Signed)
   Subjective:    Patient ID: Amanda Kelly, female    DOB: Jan 28, 1961, 55 y.o.   MRN: [id number]  HPI Patient in to discuss depression- Says that she is crying all the time- and doesn't feel like doing anything. SHe is currently on cymbalta which she says is  Not helping. SHe has some family issues going on with family ( sister dying and the rest of family does not know ).   Depression screen [facility] 2/9 08/12/2015 08/07/2014 03/05/2013  Decreased Interest 2 0 0  Down, Depressed, Hopeless 2 0 0  PHQ - 2 Score 4 0 0  Altered sleeping 2 - -  Tired, decreased energy 2 - -  Change in appetite 2 - -  Feeling bad or failure about yourself  0 - -  Trouble concentrating 1 - -  Moving slowly or fidgety/restless 0 - -  Suicidal thoughts 0 - -  PHQ-9 Score 11 - -      Review of Systems  Constitutional: Negative.   HENT: Negative.   Respiratory: Negative.   Cardiovascular: Negative.   Genitourinary: Negative.   Psychiatric/Behavioral: Positive for sleep disturbance. Negative for suicidal ideas.  All other systems reviewed and are negative.      Objective:   Physical Exam  Constitutional: She is oriented to person, place, and time. She appears well-developed and well-nourished. No distress.  Cardiovascular: Normal rate, regular rhythm and normal heart sounds.   Pulmonary/Chest: Effort normal and breath sounds normal.  Neurological: She is alert and oriented to person, place, and time.  Skin: Skin is warm.  Psychiatric: Her behavior is normal. Judgment and thought content normal.  Tearful during exam   BP 152/88 mmHg  Pulse 101  Temp(Src) 98.3 F (36.8 C) (Oral)  Ht 5\' 5"  (1.651 m)  Wt 247 lb (112.038 kg)  BMI 41.10 kg/m2        Assessment & Plan:   1. ANXIETY DEPRESSION    Meds ordered this encounter  Medications  . DISCONTD: escitalopram (LEXAPRO) 20 MG tablet    Sig: Take 1 tablet (20 mg total) by mouth daily.    Dispense:  30 tablet    Refill:  5    Order Specific  Question:  Supervising Provider    Answer:  Chipper Herb [[id number]]  . sertraline (ZOLOFT) 100 MG tablet    Sig: Take 1 tablet (100 mg total) by mouth daily.    Dispense:  30 tablet    Refill:  5    Do not fill lexapro    Order Specific Question:  Supervising Provider    Answer:  Chipper Herb [[id number]]   Was going to try lexapro but patient said she tried in past and did  Not like it. Wean off of cymbalta while starting zoloft STress management Follow up prn  Mary-Margaret Hassell Done, FNP

## 2015-08-12 NOTE — Patient Instructions (Signed)
Stress and Stress Management Stress is a normal reaction to life events. It is what you feel when life demands more than you are used to or more than you can handle. Some stress can be useful. For example, the stress reaction can help you catch the last bus of the day, study for a test, or meet a deadline at work. But stress that occurs too often or for too long can cause problems. It can affect your emotional health and interfere with relationships and normal daily activities. Too much stress can weaken your immune system and increase your risk for physical illness. If you already have a medical problem, stress can make it worse. CAUSES  All sorts of life events may cause stress. An event that causes stress for one person may not be stressful for another person. Major life events commonly cause stress. These may be positive or negative. Examples include losing your job, moving into a new home, getting married, having a baby, or losing a loved one. Less obvious life events may also cause stress, especially if they occur day after day or in combination. Examples include working long hours, driving in traffic, caring for children, being in debt, or being in a difficult relationship. SIGNS AND SYMPTOMS Stress may cause emotional symptoms including, the following:  Anxiety. This is feeling worried, afraid, on edge, overwhelmed, or out of control.  Anger. This is feeling irritated or impatient.  Depression. This is feeling sad, down, helpless, or guilty.  Difficulty focusing, remembering, or making decisions. Stress may cause physical symptoms, including the following:   Aches and pains. These may affect your head, neck, back, stomach, or other areas of your body.  Tight muscles or clenched jaw.  Low energy or trouble sleeping. Stress may cause unhealthy behaviors, including the following:   Eating to feel better (overeating) or skipping meals.  Sleeping too little, too much, or both.  Working  too much or putting off tasks (procrastination).  Smoking, drinking alcohol, or using drugs to feel better. DIAGNOSIS  Stress is diagnosed through an assessment by your health care provider. Your health care provider will ask questions about your symptoms and any stressful life events.Your health care provider will also ask about your medical history and may order blood tests or other tests. Certain medical conditions and medicine can cause physical symptoms similar to stress. Mental illness can cause emotional symptoms and unhealthy behaviors similar to stress. Your health care provider may refer you to a mental health professional for further evaluation.  TREATMENT  Stress management is the recommended treatment for stress.The goals of stress management are reducing stressful life events and coping with stress in healthy ways.  Techniques for reducing stressful life events include the following:  Stress identification. Self-monitor for stress and identify what causes stress for you. These skills may help you to avoid some stressful events.  Time management. Set your priorities, keep a calendar of events, and learn to say "no." These tools can help you avoid making too many commitments. Techniques for coping with stress include the following:  Rethinking the problem. Try to think realistically about stressful events rather than ignoring them or overreacting. Try to find the positives in a stressful situation rather than focusing on the negatives.  Exercise. Physical exercise can release both physical and emotional tension. The key is to find a form of exercise you enjoy and do it regularly.  Relaxation techniques. These relax the body and mind. Examples include yoga, meditation, tai chi, biofeedback, deep  breathing, progressive muscle relaxation, listening to music, being out in nature, journaling, and other hobbies. Again, the key is to find one or more that you enjoy and can do  regularly.  Healthy lifestyle. Eat a balanced diet, get plenty of sleep, and do not smoke. Avoid using alcohol or drugs to relax.  Strong support network. Spend time with family, friends, or other people you enjoy being around.Express your feelings and talk things over with someone you trust. Counseling or talktherapy with a mental health professional may be helpful if you are having difficulty managing stress on your own. Medicine is typically not recommended for the treatment of stress.Talk to your health care provider if you think you need medicine for symptoms of stress. HOME CARE INSTRUCTIONS  Keep all follow-up visits as directed by your health care provider.  Take all medicines as directed by your health care provider. SEEK MEDICAL CARE IF:  Your symptoms get worse or you start having new symptoms.  You feel overwhelmed by your problems and can no longer manage them on your own. SEEK IMMEDIATE MEDICAL CARE IF:  You feel like hurting yourself or someone else.   This information is not intended to replace advice given to you by your health care provider. Make sure you discuss any questions you have with your health care provider.   Document Released: 09/06/2000 Document Revised: 04/03/2014 Document Reviewed: 11/05/2012 Elsevier Interactive Patient Education 2016 Elsevier Inc.  

## 2015-08-19 ENCOUNTER — Encounter: Payer: Self-pay | Admitting: Nurse Practitioner

## 2015-09-05 ENCOUNTER — Emergency Department (HOSPITAL_BASED_OUTPATIENT_CLINIC_OR_DEPARTMENT_OTHER)
Admission: EM | Admit: 2015-09-05 | Discharge: 2015-09-05 | Disposition: A | Discharge status: Home / Self Care | Payer: BLUE CROSS/BLUE SHIELD | Attending: Emergency Medicine | Admitting: Emergency Medicine

## 2015-09-05 ENCOUNTER — Encounter (HOSPITAL_BASED_OUTPATIENT_CLINIC_OR_DEPARTMENT_OTHER): Payer: Self-pay | Admitting: *Deleted

## 2015-09-05 ENCOUNTER — Emergency Department (HOSPITAL_BASED_OUTPATIENT_CLINIC_OR_DEPARTMENT_OTHER): Payer: BLUE CROSS/BLUE SHIELD

## 2015-09-05 DIAGNOSIS — G5601 Carpal tunnel syndrome, right upper limb: Secondary | ICD-10-CM | POA: Diagnosis not present

## 2015-09-05 DIAGNOSIS — E785 Hyperlipidemia, unspecified: Secondary | ICD-10-CM | POA: Insufficient documentation

## 2015-09-05 DIAGNOSIS — I10 Essential (primary) hypertension: Secondary | ICD-10-CM | POA: Insufficient documentation

## 2015-09-05 DIAGNOSIS — G5602 Carpal tunnel syndrome, left upper limb: Secondary | ICD-10-CM | POA: Diagnosis not present

## 2015-09-05 DIAGNOSIS — M7989 Other specified soft tissue disorders: Secondary | ICD-10-CM | POA: Diagnosis not present

## 2015-09-05 DIAGNOSIS — F329 Major depressive disorder, single episode, unspecified: Secondary | ICD-10-CM | POA: Diagnosis not present

## 2015-09-05 DIAGNOSIS — Z79899 Other long term (current) drug therapy: Secondary | ICD-10-CM | POA: Diagnosis not present

## 2015-09-05 DIAGNOSIS — Z87891 Personal history of nicotine dependence: Secondary | ICD-10-CM | POA: Insufficient documentation

## 2015-09-05 DIAGNOSIS — M6281 Muscle weakness (generalized): Secondary | ICD-10-CM | POA: Diagnosis present

## 2015-09-05 MED ORDER — HYDROCODONE-ACETAMINOPHEN 5-325 MG PO TABS: 1.0000 | ORAL_TABLET | ORAL | Status: DC | PRN

## 2015-09-05 MED ORDER — HYDROCODONE-ACETAMINOPHEN 5-325 MG PO TABS
1.0000 | ORAL_TABLET | Freq: Once | ORAL | Status: AC
  Administered 2015-09-05: 1 via ORAL
  Filled 2015-09-05: qty 1

## 2015-09-05 MED ORDER — NAPROXEN 500 MG PO TABS: 500.0000 mg | ORAL_TABLET | Freq: Two times a day (BID) | ORAL | Status: DC | PRN

## 2015-09-05 MED ORDER — PREDNISONE 10 MG (21) PO TBPK: 10.0000 mg | ORAL_TABLET | Freq: Every day | ORAL | Status: DC

## 2015-09-05 NOTE — Discharge Instructions (Signed)
Wear your wrist brace at much as possible (especially at night). Take medications as prescribed. Follow-up hand surgeon listed above discuss further management, including steroid injections and potentially even surgery. Return without fail for worsening symptoms including progressive numbness or weakness involving the entirety of the arm or leg, or any other symptoms concerning to you.  Carpal Tunnel Syndrome Carpal tunnel syndrome is a condition that causes pain in your hand and arm. The carpal tunnel is a narrow area located on the palm side of your wrist. Repeated wrist motion or certain diseases may cause swelling within the tunnel. This swelling pinches the main nerve in the wrist (median nerve). CAUSES  This condition may be caused by:   Repeated wrist motions.  Wrist injuries.  Arthritis.  A cyst or tumor in the carpal tunnel.  Fluid buildup during pregnancy. Sometimes the cause of this condition is not known.  RISK FACTORS This condition is more likely to develop in:   People who have jobs that cause them to repeatedly move their wrists in the same motion, such as butchers and cashiers.  Women.  People with certain conditions, such as:  Diabetes.  Obesity.  An underactive thyroid (hypothyroidism).  Kidney failure. SYMPTOMS  Symptoms of this condition include:   A tingling feeling in your fingers, especially in your thumb, index, and middle fingers.  Tingling or numbness in your hand.  An aching feeling in your entire arm, especially when your wrist and elbow are bent for long periods of time.  Wrist pain that goes up your arm to your shoulder.  Pain that goes down into your palm or fingers.  A weak feeling in your hands. You may have trouble grabbing and holding items. Your symptoms may feel worse during the night.  DIAGNOSIS  This condition is diagnosed with a medical history and physical exam. You may also have tests, including:   An electromyogram (EMG).  This test measures electrical signals sent by your nerves into the muscles.  X-rays. TREATMENT  Treatment for this condition includes:  Lifestyle changes. It is important to stop doing or modify the activity that caused your condition.  Physical or occupational therapy.  Medicines for pain and inflammation. This may include medicine that is injected into your wrist.  A wrist splint.  Surgery. HOME CARE INSTRUCTIONS  If You Have a Splint:  Wear it as told by your health care provider. Remove it only as told by your health care provider.  Loosen the splint if your fingers become numb and tingle, or if they turn cold and blue.  Keep the splint clean and dry. General Instructions  Take over-the-counter and prescription medicines only as told by your health care provider.  Rest your wrist from any activity that may be causing your pain. If your condition is work related, talk to your employer about changes that can be made, such as getting a wrist pad to use while typing.  If directed, apply ice to the painful area:  Put ice in a plastic bag.  Place a towel between your skin and the bag.  Leave the ice on for 20 minutes, 2-3 times per day.  Keep all follow-up visits as told by your health care provider. This is important.  Do any exercises as told by your health care provider, physical therapist, or occupational therapist. [place] IF:   You have new symptoms.  Your pain is not controlled with medicines.  Your symptoms get worse.   This information is  not intended to replace advice given to you by your health care provider. Make sure you discuss any questions you have with your health care provider.   Document Released: 03/10/2000 Document Revised: 12/02/2014 Document Reviewed: 07/29/2014 Elsevier Interactive Patient Education [employer].

## 2015-09-05 NOTE — ED Notes (Signed)
Patient states approximately four days ago, she developed tingling in the left hand and forearm after working on her computer for approximately one hour.  States the numbness and tingling has progressively gotten worse with radiation up the entire arm, shoulder and shoulder blade area.  Denies past injury.  Pain woke her up last night due to worsening pain, took 600 mg Ibuprofen and iced hand.  Moderate swelling noted to hand and fingers. Neurovascular warm to touch, decreased sensation when compared to right hand, decreased grip noted over the last few days.

## 2015-09-05 NOTE — ED Provider Notes (Signed)
CSN: [id number]     Arrival date & time 09/05/15  9735 History   First MD Initiated Contact with Patient 09/05/15 1011     Chief Complaint  Patient presents with  . Extremity Weakness     (Consider location/radiation/quality/duration/timing/severity/associated sxs/prior Treatment) HPI 55 year old female who presents with left hand numbness and weakness. She has a history of hypertension, hyperlipidemia and former tobacco use. States that for some time she has had some intermittent tingling over the first 3 digits of her left hand off-and-on that would resolve on its own. Over the past 4 days she has had constant burning pain, diminished sensation, and weakness using the left hand. 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 States that her symptoms have been worsening over the past 4 days, not relieved by shaking her arm which normally helps. It feels pain that shoots from the left wrist up her arm and down her hand. He feels soreness in her forearm. Has also noted some mild soft tissue swelling involving the left wrist and over the hand. No fevers or chills, vision or speech changes, neck pain, back pain, chest pain or shortness of breath. No trauma or injury to the hand. Past Medical History  Diagnosis Date  . ANXIETY DEPRESSION   . SMOKER 2014    former  . TACHYCARDIA   . Palpitations   . Shortness of breath   . Hypertension   . Hyperlipidemia   . Wears glasses   . GERD (gastroesophageal reflux disease)    Past Surgical History  Procedure Laterality Date  . Breast mass excision Left 2009    left-papaloma  . Endometrial ablation  2010  . Colonoscopy    . Breast lumpectomy with radioactive seed localization Left 07/07/2014    Procedure: LEFT BREAST LUMPECTOMY WITH RADIOACTIVE SEED LOCALIZATION;  Surgeon: Erroll Luna, MD;  Location: [facility];  Service: General;  Laterality: Left;  . Re-excision of breast lumpectomy Left  07/22/2014    Procedure:  LEFT BREAST REEXCISION LUMPECTOMY;  Surgeon: Erroll Luna, MD;  Location: [facility];  Service: General;  Laterality: Left;   Family History  Problem Relation Age of Onset  . Breast cancer Maternal Grandmother 60  . Lung cancer Father     dx. 66s; smoker; no contact w/ father  . Breast cancer Sister     dx. 20-53; metastatic  . Memory loss Sister     short-term  . Breast cancer Maternal Aunt 60  . Cancer Paternal Uncle     kidney cancer   . Breast cancer Cousin 52    bilateral w/ metastasis  . Breast cancer Cousin     unknown age of dx  . Breast cancer Cousin 93    mastectomy  . Breast cancer Cousin 41    mastectomy  . BRCA 1/2 Cousin     testing in 2015 in [place]  . Cancer Paternal Uncle     colon cancer  . Kidney cancer Maternal Uncle     unknown age dx  . Colon cancer Maternal Uncle     unknown age dx.  . Stroke Maternal Uncle   . Breast cancer Other    Social History  Substance Use Topics  . Smoking status: Former Smoker -- 1.00 packs/day for 20 years    Types: Cigarettes    Quit date: 03/27/2008  . Smokeless tobacco: Never Used  . Alcohol Use: 1.2 oz/week  2 Glasses of wine per week     Comment: 1-2 a week    OB History    No data available     Review of Systems 10/14 systems reviewed and are negative other than those stated in the HPI    Allergies  Review of patient's allergies indicates no known allergies.  Home Medications   Prior to Admission medications   Medication Sig Start Date End Date Taking? Authorizing Provider  atorvastatin (LIPITOR) 40 MG tablet TAKE 1 TABLET (40 MG TOTAL) BY MOUTH DAILY. 03/19/15  Yes Mary-Margaret Hassell Done, FNP  DULoxetine (CYMBALTA) 60 MG capsule TAKE 1 CAPSULE (60 MG TOTAL) BY MOUTH DAILY. 03/19/15  Yes Mary-Margaret Hassell Done, FNP  lisinopril-hydrochlorothiazide (PRINZIDE,ZESTORETIC) 20-12.5 MG tablet Take 1 tablet by mouth daily. 03/19/15  Yes Mary-Margaret Hassell Done, FNP   metoprolol succinate (TOPROL-XL) 100 MG 24 hr tablet TAKE 1 TABLET (100 MG TOTAL) BY MOUTH DAILY. TAKE WITH OR IMMEDIATELY FOLLOWING A MEAL. 03/19/15  Yes Mary-Margaret Hassell Done, FNP  omeprazole (PRILOSEC) 20 MG capsule TAKE 1 TABLET (20 MG TOTAL) BY MOUTH DAILY. 03/19/15  Yes Mary-Margaret Hassell Done, FNP  sertraline (ZOLOFT) 100 MG tablet Take 1 tablet (100 mg total) by mouth daily. 08/12/15  Yes Mary-Margaret Hassell Done, FNP  HYDROcodone-acetaminophen (NORCO/VICODIN) 5-325 MG tablet Take 1 tablet by mouth every 4 (four) hours as needed for severe pain. 09/05/15   Forde Dandy, MD  naproxen (NAPROSYN) 500 MG tablet Take 1 tablet (500 mg total) by mouth 2 (two) times daily between meals as needed for mild pain or moderate pain. 09/05/15   Forde Dandy, MD  predniSONE (STERAPRED UNI-PAK 21 TAB) 10 MG (21) TBPK tablet Take 1 tablet (10 mg total) by mouth daily. Take 6 tabs by mouth daily  for 2 days, then 5 tabs for 2 days, then 4 tabs for 2 days, then 3 tabs for 2 days, 2 tabs for 2 days, then 1 tab by mouth daily for 2 days 09/05/15   Forde Dandy, MD   BP 155/100 mmHg  Pulse 93  Temp(Src) 98.6 F (37 C) (Oral)  Resp 22  Ht 5' 5"  (1.651 m)  Wt 245 lb (111.131 kg)  BMI 40.77 kg/m2  SpO2 100% Physical Exam Physical Exam  Nursing note and vitals reviewed. Constitutional: Well developed, well nourished, non-toxic, and in no acute distress Head: Normocephalic and atraumatic.  Mouth/Throat: Oropharynx is clear and moist.  Neck: Normal range of motion. Neck supple.  Cardiovascular: Normal rate and regular rhythm.   Pulmonary/Chest: Effort normal and breath sounds normal.  Abdominal: Soft. There is no tenderness. There is no rebound and no guarding.  Musculoskeletal: Normal range of motion.  Skin: Skin is warm and dry.  Psychiatric: Cooperative Neuro: Alert, oriented x 3, no facial droop, EOMI, PERRL, facial sensation in tact No pronator drift.  Diminished sensation and paresthesia over distribution of  the median nerve of the left hand. Diminished strength in innervation of median nerve. In tact innervation involving ulnar and radial nerve. In tact sensation involving remainder of extremity of the upper   ED Course  Procedures (including critical care time) Labs Review Labs Reviewed - No data to display  Imaging Review Dg Wrist Complete Left  09/05/2015  CLINICAL DATA:  Left wrist pain and swelling, medial nerve symptoms EXAM: LEFT WRIST - COMPLETE 3+ VIEW COMPARISON:  None. FINDINGS: Four views of the left wrist submitted. No acute fracture or subluxation. Mild dorsal soft tissue swelling. No periosteal reaction or bony erosion. IMPRESSION: No  acute fracture or subluxation.  Mild dorsal soft tissue swelling. Electronically Signed   By: Lahoma Crocker M.D.   On: 09/05/2015 10:59   I have personally reviewed and evaluated these images and lab results as part of my medical decision-making.   EKG Interpretation   Date/Time:  Sunday September 05 2015 09:58:51 EDT Ventricular Rate:  88 PR Interval:  183 QRS Duration: 100 QT Interval:  351 QTC Calculation: 425 R Axis:   48 Text Interpretation:  Sinus rhythm Low voltage, precordial leads No acute  changes similar to EKG 07/06/2014 Confirmed by Trebor Galdamez MD, Khaliya Golinski [phone]) on  09/05/2015 10:16:07 AM      MDM   Final diagnoses:  Carpal tunnel syndrome of left wrist    55 year old who presents with 4 days of numbness and weakness the left hand. Well-appearing no acute distress. She has symptoms consistent with that of severe carpal tunnel syndrome. Her numbness, weakness, appears that it pain involves the distribution of the median nerve only. Ulnar and radial innervation are intact. Remainder of her extremity is neurovascularly intact. The remainder of her neurological exam is intact. There is some mild soft tissue swelling of the wrist and the first 3 digits of the left hand. X-ray does not reveal any bony changes. Wrist splint is applied, provided with  steroid taper, and anti-inflammatory medications. Discussed supportive care for home. Referral made for orthopedic/hand surgery for follow-up to discuss potential glucocorticoid injections versus potential surgical management of symptoms do not improve. Strict return and follow-up instructions reviewed. She/He expressed understanding of all discharge instructions and felt comfortable with the plan of care.    Forde Dandy, MD 09/05/15 1122

## 2015-09-20 ENCOUNTER — Encounter: Payer: Self-pay | Admitting: Nurse Practitioner

## 2015-10-08 ENCOUNTER — Other Ambulatory Visit: Payer: Self-pay | Admitting: Nurse Practitioner

## 2015-10-08 ENCOUNTER — Ambulatory Visit (INDEPENDENT_AMBULATORY_CARE_PROVIDER_SITE_OTHER): Payer: BLUE CROSS/BLUE SHIELD | Admitting: Nurse Practitioner

## 2015-10-08 ENCOUNTER — Encounter: Payer: Self-pay | Admitting: Nurse Practitioner

## 2015-10-08 VITALS — BP 147/95 | HR 96 | Temp 97.5°F | Ht 65.0 in | Wt 245.0 lb

## 2015-10-08 DIAGNOSIS — G5603 Carpal tunnel syndrome, bilateral upper limbs: Secondary | ICD-10-CM

## 2015-10-08 NOTE — Patient Instructions (Signed)

## 2015-10-08 NOTE — Progress Notes (Signed)
   Subjective:    Patient ID: Amanda Kelly, female    DOB: Jul 03, 1960, 55 y.o.   MRN: [id number]  HPI Patient comes in c/o pain in bil hands- they diagnosed her with carpal tunnel- they gave her naproxyn and prednisone and she wears wrist braces.    Review of Systems  Constitutional: Negative.   Respiratory: Negative.   Cardiovascular: Negative.   Genitourinary: Negative.   Neurological: Negative.   Psychiatric/Behavioral: Negative.   All other systems reviewed and are negative.      Objective:   Physical Exam  Constitutional: She is oriented to person, place, and time. She appears well-developed and well-nourished.  Cardiovascular: Normal rate and regular rhythm.   Fingers warm to touch with 2+ radial pulse  Pulmonary/Chest: Effort normal and breath sounds normal.  Musculoskeletal:  Grips equal bil  (+) phalen bil (+) tinel bil  Neurological: She is alert and oriented to person, place, and time.  Skin: Skin is warm.  Psychiatric: She has a normal mood and affect. Her behavior is normal. Judgment and thought content normal.   BP 147/95 mmHg  Pulse 96  Temp(Src) 97.5 F (36.4 C) (Oral)  Ht 5\' 5"  (1.651 m)  Wt 245 lb (111.131 kg)  BMI 40.77 kg/m2       Assessment & Plan:   1. Bilateral carpal tunnel syndrome    Orders Placed This Encounter  Procedures  . Ambulatory referral to Orthopedic Surgery    Referral Priority:  Routine    Referral Type:  Surgical    Referral Reason:  Specialty Services Required    Referred to Provider:  Roseanne Kaufman, MD    Requested Specialty:  Orthopedic Surgery    Number of Visits Requested:  1   Wear wrist braces Soak in epsom salt RTO prn  Mary-Margaret Hassell Done, FNP

## 2015-10-13 ENCOUNTER — Other Ambulatory Visit: Payer: Self-pay | Admitting: *Deleted

## 2015-10-13 MED ORDER — DULOXETINE HCL 60 MG PO CPEP: ORAL_CAPSULE | ORAL | Status: DC

## 2015-10-14 ENCOUNTER — Other Ambulatory Visit: Payer: Self-pay | Admitting: *Deleted

## 2015-10-14 MED ORDER — OMEPRAZOLE 20 MG PO CPDR: 20.0000 mg | DELAYED_RELEASE_CAPSULE | Freq: Every day | ORAL | Status: DC

## 2015-10-14 MED ORDER — LISINOPRIL-HYDROCHLOROTHIAZIDE 20-12.5 MG PO TABS: 1.0000 | ORAL_TABLET | Freq: Every day | ORAL | Status: DC

## 2015-10-14 MED ORDER — DULOXETINE HCL 60 MG PO CPEP: ORAL_CAPSULE | ORAL | Status: DC

## 2015-11-08 ENCOUNTER — Other Ambulatory Visit: Payer: Self-pay | Admitting: Nurse Practitioner

## 2015-11-08 DIAGNOSIS — E785 Hyperlipidemia, unspecified: Secondary | ICD-10-CM

## 2015-11-11 DIAGNOSIS — G5602 Carpal tunnel syndrome, left upper limb: Secondary | ICD-10-CM | POA: Diagnosis not present

## 2015-11-11 DIAGNOSIS — G5601 Carpal tunnel syndrome, right upper limb: Secondary | ICD-10-CM | POA: Diagnosis not present

## 2015-11-27 DIAGNOSIS — G5601 Carpal tunnel syndrome, right upper limb: Secondary | ICD-10-CM | POA: Diagnosis not present

## 2015-11-27 DIAGNOSIS — G5602 Carpal tunnel syndrome, left upper limb: Secondary | ICD-10-CM | POA: Diagnosis not present

## 2015-12-10 DIAGNOSIS — G5601 Carpal tunnel syndrome, right upper limb: Secondary | ICD-10-CM | POA: Diagnosis not present

## 2015-12-10 DIAGNOSIS — G5602 Carpal tunnel syndrome, left upper limb: Secondary | ICD-10-CM | POA: Diagnosis not present

## 2015-12-27 DIAGNOSIS — G5602 Carpal tunnel syndrome, left upper limb: Secondary | ICD-10-CM | POA: Diagnosis not present

## 2016-01-05 ENCOUNTER — Other Ambulatory Visit: Payer: Self-pay | Admitting: Nurse Practitioner

## 2016-01-05 DIAGNOSIS — E785 Hyperlipidemia, unspecified: Secondary | ICD-10-CM

## 2016-01-11 DIAGNOSIS — G5602 Carpal tunnel syndrome, left upper limb: Secondary | ICD-10-CM | POA: Diagnosis not present

## 2016-01-20 ENCOUNTER — Encounter: Payer: Self-pay | Admitting: Nurse Practitioner

## 2016-01-21 ENCOUNTER — Ambulatory Visit (INDEPENDENT_AMBULATORY_CARE_PROVIDER_SITE_OTHER): Payer: BLUE CROSS/BLUE SHIELD

## 2016-01-21 DIAGNOSIS — Z23 Encounter for immunization: Secondary | ICD-10-CM

## 2016-01-26 ENCOUNTER — Other Ambulatory Visit: Payer: Self-pay

## 2016-01-26 MED ORDER — LISINOPRIL-HYDROCHLOROTHIAZIDE 20-12.5 MG PO TABS: 1.0000 | ORAL_TABLET | Freq: Every day | ORAL | 1 refills | Status: DC

## 2016-01-28 ENCOUNTER — Other Ambulatory Visit: Payer: Self-pay | Admitting: *Deleted

## 2016-01-28 MED ORDER — OMEPRAZOLE 20 MG PO CPDR: 20.0000 mg | DELAYED_RELEASE_CAPSULE | Freq: Every day | ORAL | 0 refills | Status: DC

## 2016-01-31 DIAGNOSIS — G5601 Carpal tunnel syndrome, right upper limb: Secondary | ICD-10-CM | POA: Diagnosis not present

## 2016-02-14 DIAGNOSIS — Z4789 Encounter for other orthopedic aftercare: Secondary | ICD-10-CM | POA: Diagnosis not present

## 2016-02-14 DIAGNOSIS — G5601 Carpal tunnel syndrome, right upper limb: Secondary | ICD-10-CM | POA: Diagnosis not present

## 2016-03-19 ENCOUNTER — Other Ambulatory Visit: Payer: Self-pay | Admitting: Pediatrics

## 2016-03-19 DIAGNOSIS — E785 Hyperlipidemia, unspecified: Secondary | ICD-10-CM

## 2016-03-21 NOTE — Telephone Encounter (Signed)
Last refill without being seen 

## 2016-04-14 ENCOUNTER — Encounter: Payer: Self-pay | Admitting: Nurse Practitioner

## 2016-04-19 ENCOUNTER — Other Ambulatory Visit: Payer: Self-pay | Admitting: Nurse Practitioner

## 2016-04-19 DIAGNOSIS — E785 Hyperlipidemia, unspecified: Secondary | ICD-10-CM

## 2016-04-20 NOTE — Telephone Encounter (Signed)
Pt aware NTBS before refill can be done

## 2016-04-20 NOTE — Telephone Encounter (Signed)
ntbs for labs before filling

## 2016-04-21 ENCOUNTER — Encounter: Payer: Self-pay | Admitting: Nurse Practitioner

## 2016-04-21 ENCOUNTER — Ambulatory Visit (INDEPENDENT_AMBULATORY_CARE_PROVIDER_SITE_OTHER): Payer: BLUE CROSS/BLUE SHIELD | Admitting: Nurse Practitioner

## 2016-04-21 VITALS — BP 195/118 | HR 94 | Temp 97.6°F | Ht 65.0 in | Wt 254.0 lb

## 2016-04-21 DIAGNOSIS — K219 Gastro-esophageal reflux disease without esophagitis: Secondary | ICD-10-CM | POA: Diagnosis not present

## 2016-04-21 DIAGNOSIS — Z6841 Body Mass Index (BMI) 40.0 and over, adult: Secondary | ICD-10-CM | POA: Diagnosis not present

## 2016-04-21 DIAGNOSIS — R000 Tachycardia, unspecified: Secondary | ICD-10-CM | POA: Diagnosis not present

## 2016-04-21 DIAGNOSIS — I10 Essential (primary) hypertension: Secondary | ICD-10-CM | POA: Diagnosis not present

## 2016-04-21 DIAGNOSIS — E785 Hyperlipidemia, unspecified: Secondary | ICD-10-CM

## 2016-04-21 DIAGNOSIS — F341 Dysthymic disorder: Secondary | ICD-10-CM

## 2016-04-21 MED ORDER — DULOXETINE HCL 60 MG PO CPEP: ORAL_CAPSULE | ORAL | 1 refills | Status: DC

## 2016-04-21 MED ORDER — METOPROLOL SUCCINATE ER 100 MG PO TB24: ORAL_TABLET | ORAL | 3 refills | Status: DC

## 2016-04-21 MED ORDER — ATORVASTATIN CALCIUM 40 MG PO TABS: 40.0000 mg | ORAL_TABLET | Freq: Every day | ORAL | 1 refills | Status: DC

## 2016-04-21 MED ORDER — LISINOPRIL-HYDROCHLOROTHIAZIDE 20-12.5 MG PO TABS: 2.0000 | ORAL_TABLET | Freq: Every day | ORAL | 1 refills | Status: DC

## 2016-04-21 MED ORDER — OMEPRAZOLE 20 MG PO CPDR: 20.0000 mg | DELAYED_RELEASE_CAPSULE | Freq: Every day | ORAL | 0 refills | Status: DC

## 2016-04-21 NOTE — Progress Notes (Signed)
Subjective:    Patient ID: Amanda Kelly, female    DOB: Mar 19, 1961, 56 y.o.   MRN: [id number]   Patient here today for follow up of chronic medical problems.  Outpatient Encounter Prescriptions as of 04/21/2016  Medication Sig  . atorvastatin (LIPITOR) 40 MG tablet TAKE 1 TABLET EVERY DAY  . DULoxetine (CYMBALTA) 60 MG capsule TAKE 1 CAPSULE (60 MG TOTAL) BY MOUTH DAILY.  Marland Kitchen lisinopril-hydrochlorothiazide (PRINZIDE,ZESTORETIC) 20-12.5 MG tablet Take 1 tablet by mouth daily.  . metoprolol succinate (TOPROL-XL) 100 MG 24 hr tablet TAKE 1 TABLET (100 MG TOTAL) BY MOUTH DAILY. TAKE WITH OR IMMEDIATELY FOLLOWING A MEAL.  Marland Kitchen omeprazole (PRILOSEC) 20 MG capsule Take 1 capsule (20 mg total) by mouth daily.    Hypertension  This is a chronic problem. The current episode started more than 1 year ago. The problem is unchanged. The problem is uncontrolled. Pertinent negatives include no chest pain, headaches, palpitations or shortness of breath. Risk factors for coronary artery disease include dyslipidemia, obesity and post-menopausal state. Past treatments include beta blockers. The current treatment provides mild improvement. Compliance problems include diet and exercise.   Hyperlipidemia  This is a chronic problem. The current episode started more than 1 year ago. Recent lipid tests were reviewed and are variable. Pertinent negatives include no chest pain or shortness of breath. Current antihyperlipidemic treatment includes statins. The current treatment provides moderate improvement of lipids. Compliance problems include adherence to diet and adherence to exercise.  Risk factors for coronary artery disease include dyslipidemia, hypertension, post-menopausal and obesity.  Depression/GAD Cymbalta-works great- can really tell a difference if she misses a dose. Her sister passed away several months ago- says she seems to be dealing with it ok. Breast cancer recurrent Has had lumpectomy with clear margins-  Is not going to see oncologist- they are just going to watch for now. Tachycardia Metoprolol helps keep heart rate down.    Review of Systems  Constitutional: Negative.   HENT: Negative.   Respiratory: Negative for shortness of breath.   Cardiovascular: Negative for chest pain and palpitations.  Genitourinary: Negative.   Neurological: Negative.  Negative for headaches.  Psychiatric/Behavioral: Negative.   All other systems reviewed and are negative.      Objective:   Physical Exam  Constitutional: She is oriented to person, place, and time. She appears well-developed and well-nourished.  HENT:  Nose: Nose normal.  Mouth/Throat: Oropharynx is clear and moist.  Eyes: EOM are normal.  Neck: Trachea normal, normal range of motion and full passive range of motion without pain. Neck supple. No JVD present. Carotid bruit is not present. No thyromegaly present.  Cardiovascular: Normal rate, regular rhythm, normal heart sounds and intact distal pulses.  Exam reveals no gallop and no friction rub.   No murmur heard. Pulmonary/Chest: Effort normal and breath sounds normal.  Abdominal: Soft. Bowel sounds are normal. She exhibits no distension and no mass. There is tenderness (slight epigastric pain).  Musculoskeletal: Normal range of motion.  Lymphadenopathy:    She has no cervical adenopathy.  Neurological: She is alert and oriented to person, place, and time. She has normal reflexes.  Skin: Skin is warm and dry.  Psychiatric: She has a normal mood and affect. Her behavior is normal. Judgment and thought content normal.    BP (!) 195/118   Pulse 94   Temp 97.6 F (36.4 C) (Oral)   Ht 5\' 5"  (1.651 m)   Wt 254 lb (115.2 kg)   BMI 42.27  kg/m      Assessment & Plan:   1. Hyperlipidemia with target LDL less than 100 Low fat diet - atorvastatin (LIPITOR) 40 MG tablet; Take 1 tablet (40 mg total) by mouth daily.  Dispense: 90 tablet; Refill: 1  2. Tachycardia Avoid caffeine -  metoprolol succinate (TOPROL-XL) 100 MG 24 hr tablet; TAKE 1 TABLET (100 MG TOTAL) BY MOUTH DAILY. TAKE WITH OR IMMEDIATELY FOLLOWING A MEAL.  Dispense: 90 tablet; Refill: 3  3. Essential hypertension Low sodium diet Doubled lisinopril/HCTZ - lisinopril-hydrochlorothiazide (PRINZIDE,ZESTORETIC) 20-12.5 MG tablet; Take 2 tablets by mouth daily.  Dispense: 180 tablet; Refill: 1  4. ANXIETY DEPRESSION Stress management - DULoxetine (CYMBALTA) 60 MG capsule; TAKE 1 CAPSULE (60 MG TOTAL) BY MOUTH DAILY.  Dispense: 90 capsule; Refill: 1  5. BMI 40.0-44.9, adult ([place]) Discussed diet and exercise for person with BMI >25 Will recheck weight in 3-6 months   6. Gastroesophageal reflux disease without esophagitis Avoid spicy foods Do not eat 2 hours prior to bedtime - omeprazole (PRILOSEC) 20 MG capsule; Take 1 capsule (20 mg total) by mouth daily.  Dispense: 90 capsule; Refill: 0    Labs pending Health maintenance reviewed Diet and exercise encouraged Continue all meds Follow up  In 6 months   [place], FNP

## 2016-04-21 NOTE — Patient Instructions (Signed)
DASH Eating Plan DASH stands for "Dietary Approaches to Stop Hypertension." The DASH eating plan is a healthy eating plan that has been shown to reduce high blood pressure (hypertension). Additional health benefits may include reducing the risk of type 2 diabetes mellitus, heart disease, and stroke. The DASH eating plan may also help with weight loss. What do I need to know about the DASH eating plan? For the DASH eating plan, you will follow these general guidelines:  Choose foods with less than 150 milligrams of sodium per serving (as listed on the food label).  Use salt-free seasonings or herbs instead of table salt or sea salt.  Check with your health care provider or pharmacist before using salt substitutes.  Eat lower-sodium products. These are often labeled as "low-sodium" or "no salt added."  Eat fresh foods. Avoid eating a lot of canned foods.  Eat more vegetables, fruits, and low-fat dairy products.  Choose whole grains. Look for the word "whole" as the first word in the ingredient list.  Choose fish and skinless chicken or turkey more often than red meat. Limit fish, poultry, and meat to 6 oz (170 g) each day.  Limit sweets, desserts, sugars, and sugary drinks.  Choose heart-healthy fats.  Eat more home-cooked food and less restaurant, buffet, and fast food.  Limit fried foods.  Do not fry foods. Cook foods using methods such as baking, boiling, grilling, and broiling instead.  When eating at a restaurant, ask that your food be prepared with less salt, or no salt if possible. What foods can I eat? Seek help from a dietitian for individual calorie needs. Grains  Whole grain or whole wheat bread. Brown rice. Whole grain or whole wheat pasta. Quinoa, bulgur, and whole grain cereals. Low-sodium cereals. Corn or whole wheat flour tortillas. Whole grain cornbread. Whole grain crackers. Low-sodium crackers. Vegetables  Fresh or frozen vegetables (raw, steamed, roasted, or  grilled). Low-sodium or reduced-sodium tomato and vegetable juices. Low-sodium or reduced-sodium tomato sauce and paste. Low-sodium or reduced-sodium canned vegetables. Fruits  All fresh, canned (in natural juice), or frozen fruits. Meat and Other Protein Products  Ground beef (85% or leaner), grass-fed beef, or beef trimmed of fat. Skinless chicken or turkey. Ground chicken or turkey. Pork trimmed of fat. All fish and seafood. Eggs. Dried beans, peas, or lentils. Unsalted nuts and seeds. Unsalted canned beans. Dairy  Low-fat dairy products, such as skim or 1% milk, 2% or reduced-fat cheeses, low-fat ricotta or cottage cheese, or plain low-fat yogurt. Low-sodium or reduced-sodium cheeses. Fats and Oils  Tub margarines without trans fats. Light or reduced-fat mayonnaise and salad dressings (reduced sodium). Avocado. Safflower, olive, or canola oils. Natural peanut or almond butter. Other  Unsalted popcorn and pretzels. The items listed above may not be a complete list of recommended foods or beverages. Contact your dietitian for more options.  What foods are not recommended? Grains  White bread. White pasta. White rice. Refined cornbread. Bagels and croissants. Crackers that contain trans fat. Vegetables  Creamed or fried vegetables. Vegetables in a cheese sauce. Regular canned vegetables. Regular canned tomato sauce and paste. Regular tomato and vegetable juices. Fruits  Canned fruit in light or heavy syrup. Fruit juice. Meat and Other Protein Products  Fatty cuts of meat. Ribs, chicken wings, bacon, sausage, bologna, salami, chitterlings, fatback, hot dogs, bratwurst, and packaged luncheon meats. Salted nuts and seeds. Canned beans with salt. Dairy  Whole or 2% milk, cream, half-and-half, and cream cheese. Whole-fat or sweetened yogurt. Full-fat cheeses   or blue cheese. Nondairy creamers and whipped toppings. Processed cheese, cheese spreads, or cheese curds. Condiments  Onion and garlic  salt, seasoned salt, table salt, and sea salt. Canned and packaged gravies. Worcestershire sauce. Tartar sauce. Barbecue sauce. Teriyaki sauce. Soy sauce, including reduced sodium. Steak sauce. Fish sauce. Oyster sauce. Cocktail sauce. Horseradish. Ketchup and mustard. Meat flavorings and tenderizers. Bouillon cubes. Hot sauce. Tabasco sauce. Marinades. Taco seasonings. Relishes. Fats and Oils  Butter, stick margarine, lard, shortening, ghee, and bacon fat. Coconut, palm kernel, or palm oils. Regular salad dressings. Other  Pickles and olives. Salted popcorn and pretzels. The items listed above may not be a complete list of foods and beverages to avoid. Contact your dietitian for more information.  Where can I find more information? National Heart, Lung, and Blood Institute: www.nhlbi.nih.gov/health/health-topics/topics/dash/ This information is not intended to replace advice given to you by your health care provider. Make sure you discuss any questions you have with your health care provider. Document Released: 03/02/2011 Document Revised: 08/19/2015 Document Reviewed: 01/15/2013 Elsevier Interactive Patient Education  2017 Elsevier Inc.  

## 2016-04-21 NOTE — Addendum Note (Signed)
Addended by: Rolena Infante on: 04/21/2016 11:43 AM   Modules accepted: Orders

## 2016-04-22 LAB — BMP8+EGFR
BUN/Creatinine Ratio: 14 (ref 9–23)
BUN: 9 mg/dL (ref 6–24)
CALCIUM: 9.3 mg/dL (ref 8.7–10.2)
CHLORIDE: 90 mmol/L — AB (ref 96–106)
CO2: 22 mmol/L (ref 18–29)
Creatinine, Ser: 0.64 mg/dL (ref 0.57–1.00)
GFR calc Af Amer: 118 mL/min/{1.73_m2} (ref >59)
GFR calc non Af Amer: 102 mL/min/{1.73_m2} (ref >59)
GLUCOSE: 82 mg/dL (ref 65–99)
POTASSIUM: 4.5 mmol/L (ref 3.5–5.2)
Sodium: 132 mmol/L — ABNORMAL LOW (ref 134–144)

## 2016-04-22 LAB — LIPID PANEL
CHOLESTEROL TOTAL: 158 mg/dL (ref 100–199)
Chol/HDL Ratio: 1.9 ratio units (ref 0.0–4.4)
HDL: 85 mg/dL (ref >39)
LDL Calculated: 56 mg/dL (ref 0–99)
Triglycerides: 84 mg/dL (ref 0–149)
VLDL Cholesterol Cal: 17 mg/dL (ref 5–40)

## 2016-04-25 ENCOUNTER — Encounter: Payer: Self-pay | Admitting: Nurse Practitioner

## 2016-05-06 ENCOUNTER — Other Ambulatory Visit: Payer: Self-pay | Admitting: Nurse Practitioner

## 2016-05-06 DIAGNOSIS — R000 Tachycardia, unspecified: Secondary | ICD-10-CM

## 2016-06-20 ENCOUNTER — Encounter: Payer: Self-pay | Admitting: Nurse Practitioner

## 2016-07-17 ENCOUNTER — Other Ambulatory Visit: Payer: Self-pay | Admitting: Nurse Practitioner

## 2016-07-17 DIAGNOSIS — R921 Mammographic calcification found on diagnostic imaging of breast: Secondary | ICD-10-CM

## 2016-07-31 ENCOUNTER — Ambulatory Visit
Admission: RE | Admit: 2016-07-31 | Discharge: 2016-07-31 | Disposition: A | Discharge status: Home / Self Care | Payer: BLUE CROSS/BLUE SHIELD | Source: Ambulatory Visit | Attending: Nurse Practitioner | Admitting: Nurse Practitioner

## 2016-07-31 DIAGNOSIS — R928 Other abnormal and inconclusive findings on diagnostic imaging of breast: Secondary | ICD-10-CM | POA: Diagnosis not present

## 2016-07-31 DIAGNOSIS — R921 Mammographic calcification found on diagnostic imaging of breast: Secondary | ICD-10-CM

## 2016-08-21 ENCOUNTER — Other Ambulatory Visit: Payer: Self-pay | Admitting: Nurse Practitioner

## 2016-08-21 ENCOUNTER — Encounter: Payer: Self-pay | Admitting: Nurse Practitioner

## 2016-08-21 DIAGNOSIS — K219 Gastro-esophageal reflux disease without esophagitis: Secondary | ICD-10-CM

## 2016-10-16 ENCOUNTER — Other Ambulatory Visit: Payer: Self-pay | Admitting: Nurse Practitioner

## 2016-10-16 DIAGNOSIS — E785 Hyperlipidemia, unspecified: Secondary | ICD-10-CM

## 2016-10-16 DIAGNOSIS — I10 Essential (primary) hypertension: Secondary | ICD-10-CM

## 2016-11-11 ENCOUNTER — Other Ambulatory Visit: Payer: Self-pay | Admitting: Nurse Practitioner

## 2016-11-11 DIAGNOSIS — I10 Essential (primary) hypertension: Secondary | ICD-10-CM

## 2016-11-11 DIAGNOSIS — E785 Hyperlipidemia, unspecified: Secondary | ICD-10-CM

## 2016-11-19 ENCOUNTER — Other Ambulatory Visit: Payer: Self-pay | Admitting: Family Medicine

## 2016-11-19 DIAGNOSIS — K219 Gastro-esophageal reflux disease without esophagitis: Secondary | ICD-10-CM

## 2016-11-21 NOTE — Telephone Encounter (Signed)
Last seen 04/21/16  MMM

## 2016-11-24 ENCOUNTER — Other Ambulatory Visit: Payer: Self-pay | Admitting: Nurse Practitioner

## 2016-11-24 DIAGNOSIS — F341 Dysthymic disorder: Secondary | ICD-10-CM

## 2016-12-03 ENCOUNTER — Other Ambulatory Visit: Payer: Self-pay | Admitting: Nurse Practitioner

## 2016-12-03 DIAGNOSIS — F341 Dysthymic disorder: Secondary | ICD-10-CM

## 2016-12-14 ENCOUNTER — Encounter: Payer: Self-pay | Admitting: Nurse Practitioner

## 2016-12-14 ENCOUNTER — Ambulatory Visit (INDEPENDENT_AMBULATORY_CARE_PROVIDER_SITE_OTHER): Payer: BLUE CROSS/BLUE SHIELD | Admitting: Nurse Practitioner

## 2016-12-14 VITALS — BP 167/101 | HR 96 | Temp 97.8°F | Ht 65.0 in | Wt 250.0 lb

## 2016-12-14 DIAGNOSIS — R000 Tachycardia, unspecified: Secondary | ICD-10-CM

## 2016-12-14 DIAGNOSIS — F341 Dysthymic disorder: Secondary | ICD-10-CM

## 2016-12-14 DIAGNOSIS — E785 Hyperlipidemia, unspecified: Secondary | ICD-10-CM | POA: Diagnosis not present

## 2016-12-14 DIAGNOSIS — Z6841 Body Mass Index (BMI) 40.0 and over, adult: Secondary | ICD-10-CM

## 2016-12-14 DIAGNOSIS — K219 Gastro-esophageal reflux disease without esophagitis: Secondary | ICD-10-CM

## 2016-12-14 DIAGNOSIS — I10 Essential (primary) hypertension: Secondary | ICD-10-CM

## 2016-12-14 MED ORDER — METOPROLOL SUCCINATE ER 100 MG PO TB24: ORAL_TABLET | ORAL | 1 refills | Status: DC

## 2016-12-14 MED ORDER — OMEPRAZOLE 20 MG PO CPDR: DELAYED_RELEASE_CAPSULE | ORAL | 1 refills | Status: DC

## 2016-12-14 MED ORDER — DULOXETINE HCL 60 MG PO CPEP: ORAL_CAPSULE | ORAL | 1 refills | Status: DC

## 2016-12-14 MED ORDER — AMLODIPINE BESYLATE 5 MG PO TABS: 5.0000 mg | ORAL_TABLET | Freq: Every day | ORAL | 1 refills | Status: DC

## 2016-12-14 NOTE — Progress Notes (Signed)
Subjective:    Patient ID: Amanda Kelly, female    DOB: September 09, 1960, 56 y.o.   MRN: [id number]  HPI  GERALDIN HABERMEHL is here today for follow up of chronic medical problem.  Outpatient Encounter Prescriptions as of 12/14/2016  Medication Sig  . atorvastatin (LIPITOR) 40 MG tablet TAKE 1 TABLET (40 MG TOTAL) BY MOUTH DAILY.  Marland Kitchen atorvastatin (LIPITOR) 40 MG tablet TAKE 1 TABLET (40 MG TOTAL) BY MOUTH DAILY.  . DULoxetine (CYMBALTA) 60 MG capsule TAKE 1 CAPSULE (60 MG TOTAL) BY MOUTH DAILY.  Marland Kitchen lisinopril-hydrochlorothiazide (PRINZIDE,ZESTORETIC) 20-12.5 MG tablet TAKE 2 TABLETS BY MOUTH DAILY.  Marland Kitchen lisinopril-hydrochlorothiazide (PRINZIDE,ZESTORETIC) 20-12.5 MG tablet TAKE 2 TABLETS BY MOUTH DAILY.  . metoprolol succinate (TOPROL-XL) 100 MG 24 hr tablet TAKE 1 TABLET (100 MG TOTAL) BY MOUTH DAILY. TAKE WITH OR IMMEDIATELY FOLLOWING A MEAL.  . metoprolol succinate (TOPROL-XL) 100 MG 24 hr tablet TAKE 1 TABLET (100 MG TOTAL) BY MOUTH DAILY. TAKE WITH OR IMMEDIATELY FOLLOWING A MEAL.  Marland Kitchen omeprazole (PRILOSEC) 20 MG capsule TAKE 1 CAPSULE BY MOUTH EVERY DAY   No facility-administered encounter medications on file as of 12/14/2016.     1. Essential hypertension  No c/o chest pain sob or headache. Does not check blood pressure at home  2. Gastroesophageal reflux disease without esophagitis  Takes omeprazole daily. No c/o symptoms  3. Hyperlipidemia with target LDL less than 100  Does not watch diet  4. BMI 40.0-44.9, adult ([facility])   no recent weight changes  5. ANXIETY DEPRESSION  does stress management    New complaints: none  Social history: Has 3 adopted sons    Review of Systems  Constitutional: Negative for activity change and appetite change.  HENT: Negative.   Eyes: Negative for pain.  Respiratory: Negative for shortness of breath.   Cardiovascular: Negative for chest pain, palpitations and leg swelling.  Gastrointestinal: Negative for abdominal pain.  Endocrine: Negative for  polydipsia.  Genitourinary: Negative.   Skin: Negative for rash.  Neurological: Negative for dizziness, weakness and headaches.  Hematological: Does not bruise/bleed easily.  Psychiatric/Behavioral: Negative.   All other systems reviewed and are negative.      Objective:   Physical Exam  Constitutional: She is oriented to person, place, and time. She appears well-developed and well-nourished.  HENT:  Nose: Nose normal.  Mouth/Throat: Oropharynx is clear and moist.  Eyes: EOM are normal.  Neck: Trachea normal, normal range of motion and full passive range of motion without pain. Neck supple. No JVD present. Carotid bruit is not present. No thyromegaly present.  Cardiovascular: Normal rate, regular rhythm, normal heart sounds and intact distal pulses.  Exam reveals no gallop and no friction rub.   No murmur heard. Pulmonary/Chest: Effort normal and breath sounds normal.  Abdominal: Soft. Bowel sounds are normal. She exhibits no distension and no mass. There is no tenderness.  Musculoskeletal: Normal range of motion.  Lymphadenopathy:    She has no cervical adenopathy.  Neurological: She is alert and oriented to person, place, and time. She has normal reflexes.  Skin: Skin is warm and dry.  Psychiatric: She has a normal mood and affect. Her behavior is normal. Judgment and thought content normal.    BP (!) 167/101   Pulse 96   Temp 97.8 F (36.6 C) (Oral)   Ht 5' 5"  (1.651 m)   Wt 250 lb (113.4 kg)   BMI 41.60 kg/m        Assessment & Plan:  1. Essential hypertension Low sodium diet - CMP14+EGFR Added amlodipine to meds Keep check of blood pressure at home - amLODipine (NORVASC) 5 MG tablet; Take 1 tablet (5 mg total) by mouth daily.  Dispense: 90 tablet; Refill: 1  2. Gastroesophageal reflux disease without esophagitis Avoid spicy foods Do not eat 2 hours prior to bedtime - omeprazole (PRILOSEC) 20 MG capsule; TAKE 1 CAPSULE BY MOUTH EVERY DAY  Dispense: 90 capsule;  Refill: 1  3. Hyperlipidemia with target LDL less than 100 Low fat diet - Lipid panel  4. BMI 40.0-44.9, adult ([facility]) Discussed diet and exercise for person with BMI >25 Will recheck weight in 3-6 months  5. ANXIETY DEPRESSION Stress maangenment - DULoxetine (CYMBALTA) 60 MG capsule; TAKE 1 CAPSULE (60 MG TOTAL) BY MOUTH DAILY.  Dispense: 90 capsule; Refill: 1  6. Tachycardia Avoid caffeine - metoprolol succinate (TOPROL-XL) 100 MG 24 hr tablet; TAKE 1 TABLET (100 MG TOTAL) BY MOUTH DAILY. TAKE WITH OR IMMEDIATELY FOLLOWING A MEAL.  Dispense: 90 tablet; Refill: 1    Labs pending Health maintenance reviewed Diet and exercise encouraged Continue all meds Follow up  In 6 months   [place], FNP

## 2016-12-14 NOTE — Patient Instructions (Signed)
DASH Eating Plan DASH stands for "Dietary Approaches to Stop Hypertension." The DASH eating plan is a healthy eating plan that has been shown to reduce high blood pressure (hypertension). It may also reduce your risk for type 2 diabetes, heart disease, and stroke. The DASH eating plan may also help with weight loss. What are tips for following this plan? General guidelines  Avoid eating more than 2,300 mg (milligrams) of salt (sodium) a day. If you have hypertension, you may need to reduce your sodium intake to 1,500 mg a day.  Limit alcohol intake to no more than 1 drink a day for nonpregnant women and 2 drinks a day for men. One drink equals 12 oz of beer, 5 oz of wine, or 1 oz of hard liquor.  Work with your health care provider to maintain a healthy body weight or to lose weight. Ask what an ideal weight is for you.  Get at least 30 minutes of exercise that causes your heart to beat faster (aerobic exercise) most days of the week. Activities may include walking, swimming, or biking.  Work with your health care provider or diet and nutrition specialist (dietitian) to adjust your eating plan to your individual calorie needs. Reading food labels  Check food labels for the amount of sodium per serving. Choose foods with less than 5 percent of the Daily Value of sodium. Generally, foods with less than 300 mg of sodium per serving fit into this eating plan.  To find whole grains, look for the word "whole" as the first word in the ingredient list. Shopping  Buy products labeled as "low-sodium" or "no salt added."  Buy fresh foods. Avoid canned foods and premade or frozen meals. Cooking  Avoid adding salt when cooking. Use salt-free seasonings or herbs instead of table salt or sea salt. Check with your health care provider or pharmacist before using salt substitutes.  Do not fry foods. Cook foods using healthy methods such as baking, boiling, grilling, and broiling instead.  Cook with  heart-healthy oils, such as olive, canola, soybean, or sunflower oil. Meal planning   Eat a balanced diet that includes: ? 5 or more servings of fruits and vegetables each day. At each meal, try to fill half of your plate with fruits and vegetables. ? Up to 6-8 servings of whole grains each day. ? Less than 6 oz of lean meat, poultry, or fish each day. A 3-oz serving of meat is about the same size as a deck of cards. One egg equals 1 oz. ? 2 servings of low-fat dairy each day. ? A serving of nuts, seeds, or beans 5 times each week. ? Heart-healthy fats. Healthy fats called Omega-3 fatty acids are found in foods such as flaxseeds and coldwater fish, like sardines, salmon, and mackerel.  Limit how much you eat of the following: ? Canned or prepackaged foods. ? Food that is high in trans fat, such as fried foods. ? Food that is high in saturated fat, such as fatty meat. ? Sweets, desserts, sugary drinks, and other foods with added sugar. ? Full-fat dairy products.  Do not salt foods before eating.  Try to eat at least 2 vegetarian meals each week.  Eat more home-cooked food and less restaurant, buffet, and fast food.  When eating at a restaurant, ask that your food be prepared with less salt or no salt, if possible. What foods are recommended? The items listed may not be a complete list. Talk with your dietitian about what   dietary choices are best for you. Grains Whole-grain or whole-wheat bread. Whole-grain or whole-wheat pasta. Brown rice. Oatmeal. Quinoa. Bulgur. Whole-grain and low-sodium cereals. Pita bread. Low-fat, low-sodium crackers. Whole-wheat flour tortillas. Vegetables Fresh or frozen vegetables (raw, steamed, roasted, or grilled). Low-sodium or reduced-sodium tomato and vegetable juice. Low-sodium or reduced-sodium tomato sauce and tomato paste. Low-sodium or reduced-sodium canned vegetables. Fruits All fresh, dried, or frozen fruit. Canned fruit in natural juice (without  added sugar). Meat and other protein foods Skinless chicken or turkey. Ground chicken or turkey. Pork with fat trimmed off. Fish and seafood. Egg whites. Dried beans, peas, or lentils. Unsalted nuts, nut butters, and seeds. Unsalted canned beans. Lean cuts of beef with fat trimmed off. Low-sodium, lean deli meat. Dairy Low-fat (1%) or fat-free (skim) milk. Fat-free, low-fat, or reduced-fat cheeses. Nonfat, low-sodium ricotta or cottage cheese. Low-fat or nonfat yogurt. Low-fat, low-sodium cheese. Fats and oils Soft margarine without trans fats. Vegetable oil. Low-fat, reduced-fat, or light mayonnaise and salad dressings (reduced-sodium). Canola, safflower, olive, soybean, and sunflower oils. Avocado. Seasoning and other foods Herbs. Spices. Seasoning mixes without salt. Unsalted popcorn and pretzels. Fat-free sweets. What foods are not recommended? The items listed may not be a complete list. Talk with your dietitian about what dietary choices are best for you. Grains Baked goods made with fat, such as croissants, muffins, or some breads. Dry pasta or rice meal packs. Vegetables Creamed or fried vegetables. Vegetables in a cheese sauce. Regular canned vegetables (not low-sodium or reduced-sodium). Regular canned tomato sauce and paste (not low-sodium or reduced-sodium). Regular tomato and vegetable juice (not low-sodium or reduced-sodium). Pickles. Olives. Fruits Canned fruit in a light or heavy syrup. Fried fruit. Fruit in cream or butter sauce. Meat and other protein foods Fatty cuts of meat. Ribs. Fried meat. Bacon. Sausage. Bologna and other processed lunch meats. Salami. Fatback. Hotdogs. Bratwurst. Salted nuts and seeds. Canned beans with added salt. Canned or smoked fish. Whole eggs or egg yolks. Chicken or turkey with skin. Dairy Whole or 2% milk, cream, and half-and-half. Whole or full-fat cream cheese. Whole-fat or sweetened yogurt. Full-fat cheese. Nondairy creamers. Whipped toppings.  Processed cheese and cheese spreads. Fats and oils Butter. Stick margarine. Lard. Shortening. Ghee. Bacon fat. Tropical oils, such as coconut, palm kernel, or palm oil. Seasoning and other foods Salted popcorn and pretzels. Onion salt, garlic salt, seasoned salt, table salt, and sea salt. Worcestershire sauce. Tartar sauce. Barbecue sauce. Teriyaki sauce. Soy sauce, including reduced-sodium. Steak sauce. Canned and packaged gravies. Fish sauce. Oyster sauce. Cocktail sauce. Horseradish that you find on the shelf. Ketchup. Mustard. Meat flavorings and tenderizers. Bouillon cubes. Hot sauce and Tabasco sauce. Premade or packaged marinades. Premade or packaged taco seasonings. Relishes. Regular salad dressings. Where to find more information:  National Heart, Lung, and Blood Institute: www.nhlbi.nih.gov  American Heart Association: www.heart.org Summary  The DASH eating plan is a healthy eating plan that has been shown to reduce high blood pressure (hypertension). It may also reduce your risk for type 2 diabetes, heart disease, and stroke.  With the DASH eating plan, you should limit salt (sodium) intake to 2,300 mg a day. If you have hypertension, you may need to reduce your sodium intake to 1,500 mg a day.  When on the DASH eating plan, aim to eat more fresh fruits and vegetables, whole grains, lean proteins, low-fat dairy, and heart-healthy fats.  Work with your health care provider or diet and nutrition specialist (dietitian) to adjust your eating plan to your individual   calorie needs. This information is not intended to replace advice given to you by your health care provider. Make sure you discuss any questions you have with your health care provider. Document Released: 03/02/2011 Document Revised: 03/06/2016 Document Reviewed: 03/06/2016 Elsevier Interactive Patient Education  2017 Elsevier Inc.  

## 2017-01-10 ENCOUNTER — Ambulatory Visit (INDEPENDENT_AMBULATORY_CARE_PROVIDER_SITE_OTHER): Payer: BLUE CROSS/BLUE SHIELD | Admitting: Nurse Practitioner

## 2017-01-10 ENCOUNTER — Encounter: Payer: Self-pay | Admitting: Nurse Practitioner

## 2017-01-10 VITALS — BP 138/87 | HR 89 | Temp 96.7°F | Ht 65.0 in | Wt 249.0 lb

## 2017-01-10 DIAGNOSIS — D229 Melanocytic nevi, unspecified: Secondary | ICD-10-CM

## 2017-01-10 DIAGNOSIS — Z23 Encounter for immunization: Secondary | ICD-10-CM

## 2017-01-10 DIAGNOSIS — L918 Other hypertrophic disorders of the skin: Secondary | ICD-10-CM

## 2017-01-10 NOTE — Progress Notes (Signed)
   Subjective:    Patient ID: Amanda Kelly, female    DOB: [birth year], 56 y.o.   MRN: [id number]  HPI Patient comes in today for removal of multiple skin tags on neck and chest as well as freezing of 2  Moles on anterior chest wall.    Review of Systems  Constitutional: Negative.   Respiratory: Negative.   Cardiovascular: Negative.   Skin:       Skin tags on neck and chest 2 moles on anterior chest wall  Neurological: Negative.   Psychiatric/Behavioral: Negative.   All other systems reviewed and are negative.      Objective:   Physical Exam  Constitutional: She appears well-developed and well-nourished. No distress.  Cardiovascular: Normal rate and regular rhythm.   Pulmonary/Chest: Effort normal and breath sounds normal.  Skin: Skin is warm.  Multiple skin tags on bil neckl- varying in size 1cm flesh colored annular macular lesion right anterior chest wall 2cm browninh annular macular lesion with smooth borders mid chest wall  Psychiatric: She has a normal mood and affect. Her behavior is normal. Judgment and thought content normal.   Procedures:  Betadine prep to skin tags- removed with stevens scissors and pick ups- monsels applied  Cryotherapy to moles anterior chest wall X2       Assessment & Plan:   1. Skin tags, multiple acquired   2. Need for immunization against influenza   3. Benign skin mole    Cryotherapy care discussed Do not pick or scratch at areas Watch for signs of infection RTO prn  Mary-Margaret Hassell Done, FNP

## 2017-05-23 ENCOUNTER — Other Ambulatory Visit: Payer: Self-pay | Admitting: Nurse Practitioner

## 2017-05-23 DIAGNOSIS — I10 Essential (primary) hypertension: Secondary | ICD-10-CM

## 2017-05-24 ENCOUNTER — Encounter: Payer: Self-pay | Admitting: Nurse Practitioner

## 2017-05-31 IMAGING — DX DG WRIST COMPLETE 3+V*L*
4 series · 4 of 4 positions shown · non-contrast
Comparison: None.

CLINICAL DATA: Left wrist pain and swelling, medial nerve symptoms

EXAM:
LEFT WRIST - COMPLETE 3+ VIEW

[wrist pa]
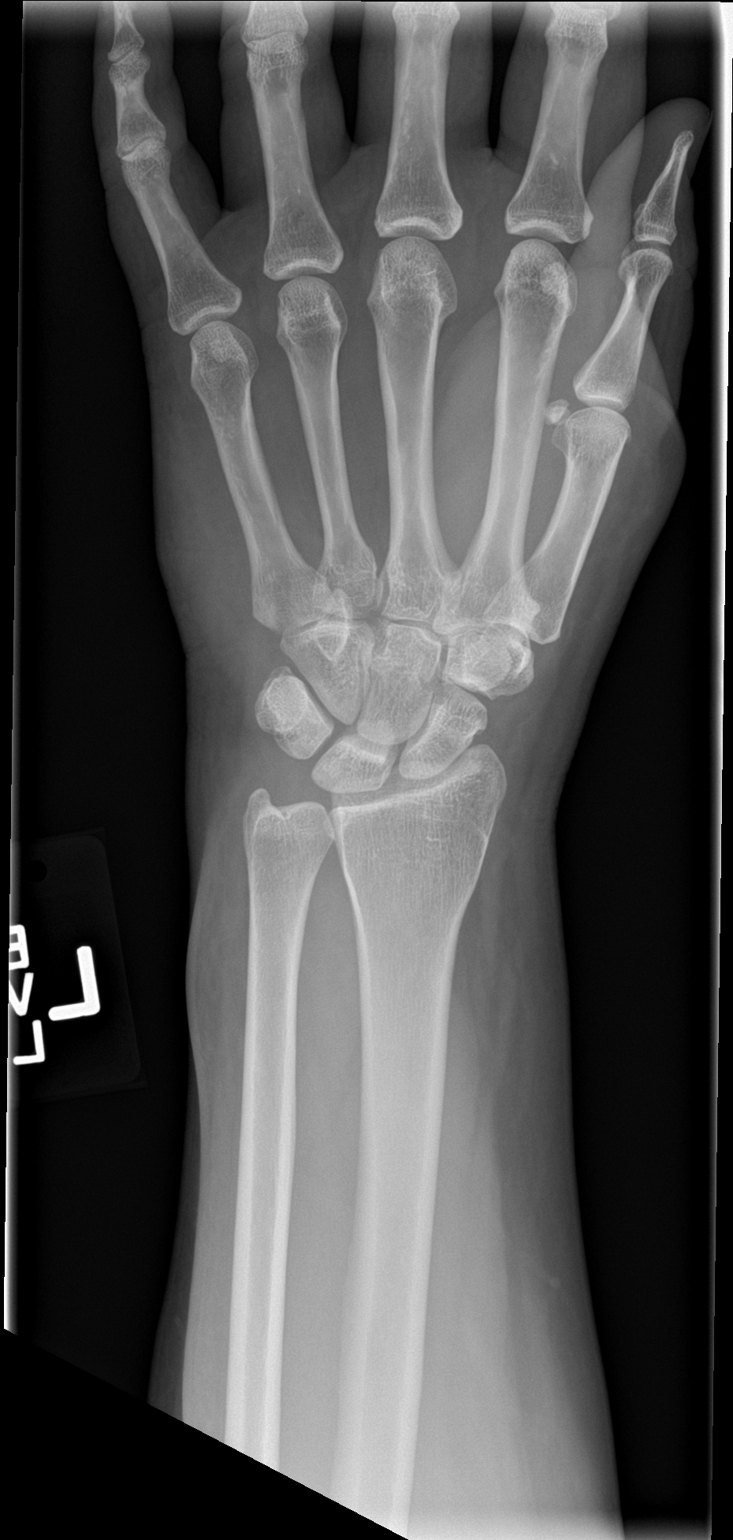

[wrist obl]
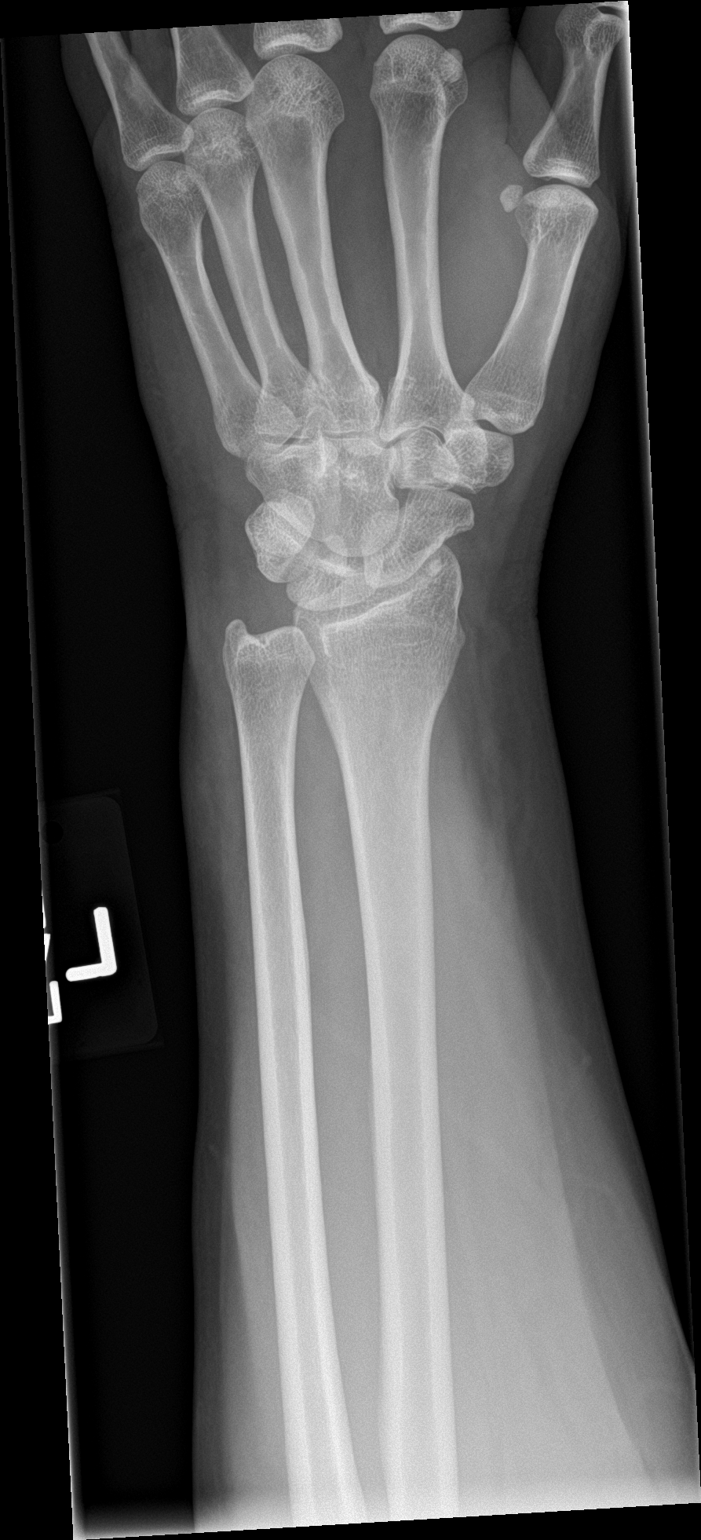

[wrist lat]
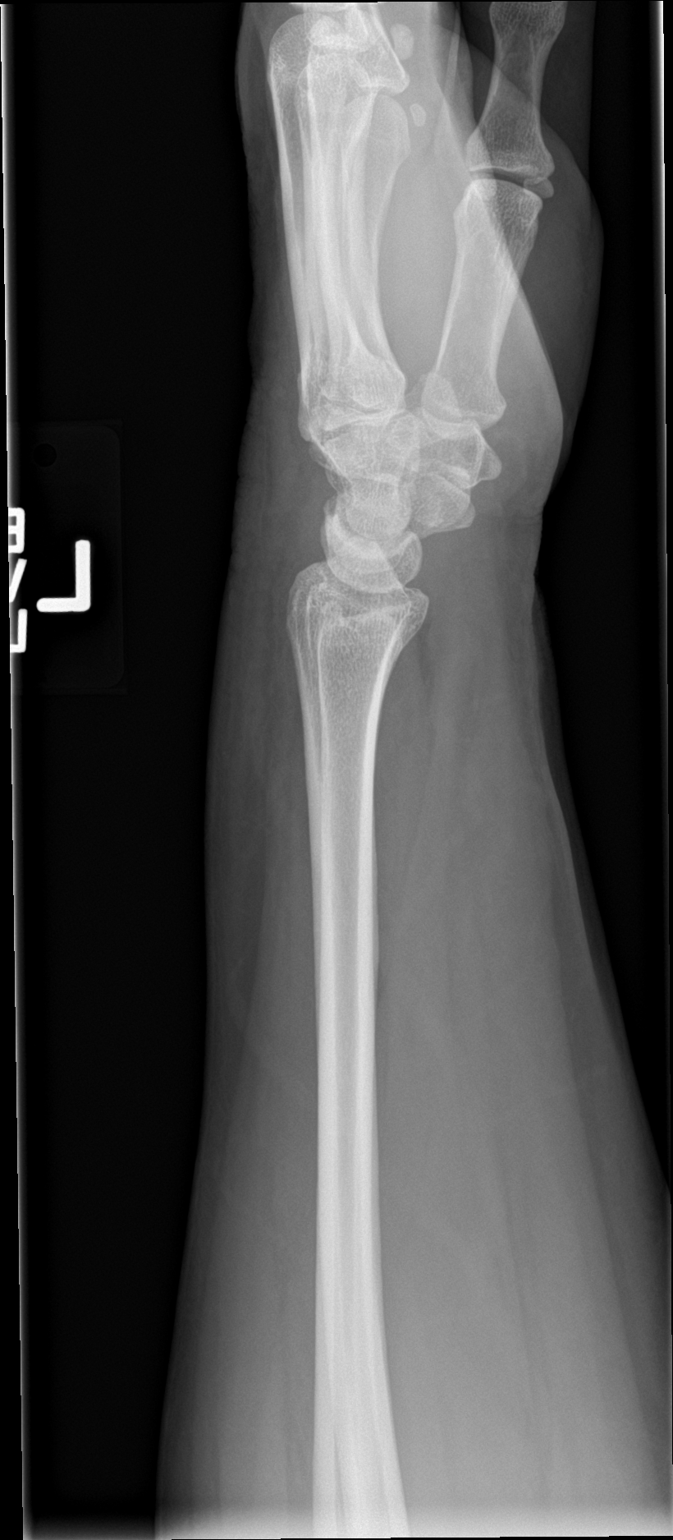

[wrist navicular]
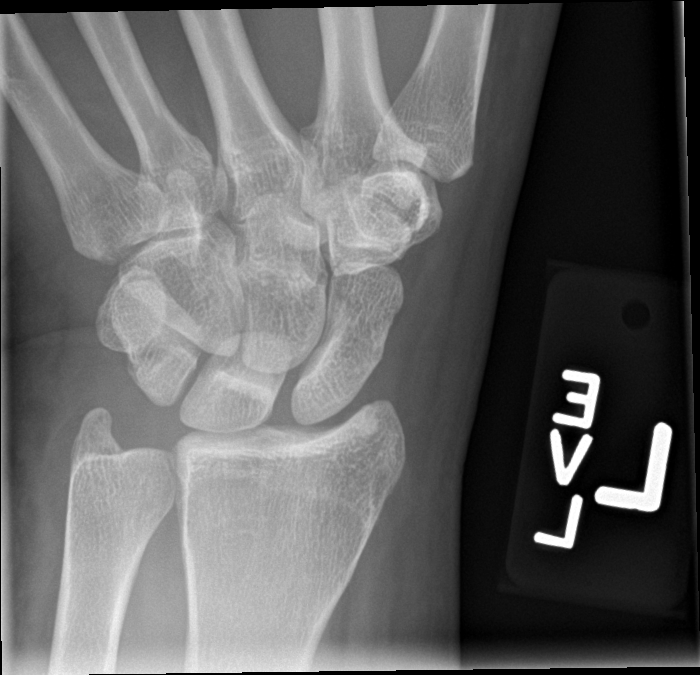

[4 of 4 positions shown; findings below may reference images not displayed]

FINDINGS: Four views of the left wrist submitted. No acute fracture or
subluxation. Mild dorsal soft tissue swelling. No periosteal
reaction or bony erosion.
IMPRESSION: No acute fracture or subluxation.  Mild dorsal soft tissue swelling.

## 2017-06-22 ENCOUNTER — Other Ambulatory Visit: Payer: Self-pay | Admitting: Nurse Practitioner

## 2017-06-22 DIAGNOSIS — Z139 Encounter for screening, unspecified: Secondary | ICD-10-CM

## 2017-07-01 ENCOUNTER — Other Ambulatory Visit: Payer: Self-pay | Admitting: Nurse Practitioner

## 2017-07-01 DIAGNOSIS — I10 Essential (primary) hypertension: Secondary | ICD-10-CM

## 2017-07-04 ENCOUNTER — Other Ambulatory Visit: Payer: Self-pay | Admitting: Nurse Practitioner

## 2017-07-04 DIAGNOSIS — F341 Dysthymic disorder: Secondary | ICD-10-CM

## 2017-07-04 NOTE — Telephone Encounter (Signed)
OV 07/05/17 

## 2017-07-05 ENCOUNTER — Ambulatory Visit: Payer: BLUE CROSS/BLUE SHIELD | Admitting: Nurse Practitioner

## 2017-07-05 ENCOUNTER — Encounter: Payer: Self-pay | Admitting: Nurse Practitioner

## 2017-07-05 VITALS — BP 135/82 | HR 86 | Temp 96.5°F | Ht 65.0 in | Wt 242.0 lb

## 2017-07-05 DIAGNOSIS — I10 Essential (primary) hypertension: Secondary | ICD-10-CM | POA: Diagnosis not present

## 2017-07-05 DIAGNOSIS — F341 Dysthymic disorder: Secondary | ICD-10-CM | POA: Diagnosis not present

## 2017-07-05 DIAGNOSIS — R000 Tachycardia, unspecified: Secondary | ICD-10-CM | POA: Diagnosis not present

## 2017-07-05 DIAGNOSIS — D242 Benign neoplasm of left breast: Secondary | ICD-10-CM | POA: Diagnosis not present

## 2017-07-05 DIAGNOSIS — Z6841 Body Mass Index (BMI) 40.0 and over, adult: Secondary | ICD-10-CM

## 2017-07-05 DIAGNOSIS — K219 Gastro-esophageal reflux disease without esophagitis: Secondary | ICD-10-CM

## 2017-07-05 DIAGNOSIS — E785 Hyperlipidemia, unspecified: Secondary | ICD-10-CM

## 2017-07-05 LAB — CMP14+EGFR
A/G RATIO: 1.8 (ref 1.2–2.2)
ALK PHOS: 101 IU/L (ref 39–117)
ALT: 36 IU/L — AB (ref 0–32)
AST: 44 IU/L — AB (ref 0–40)
Albumin: 4.6 g/dL (ref 3.5–5.5)
BILIRUBIN TOTAL: 0.5 mg/dL (ref 0.0–1.2)
BUN/Creatinine Ratio: 15 (ref 9–23)
BUN: 10 mg/dL (ref 6–24)
CHLORIDE: 87 mmol/L — AB (ref 96–106)
CO2: 25 mmol/L (ref 20–29)
Calcium: 9.8 mg/dL (ref 8.7–10.2)
Creatinine, Ser: 0.66 mg/dL (ref 0.57–1.00)
GFR calc non Af Amer: 100 mL/min/{1.73_m2} (ref >59)
GFR, EST AFRICAN AMERICAN: 116 mL/min/{1.73_m2} (ref >59)
GLUCOSE: 84 mg/dL (ref 65–99)
Globulin, Total: 2.5 g/dL (ref 1.5–4.5)
Potassium: 4.9 mmol/L (ref 3.5–5.2)
Sodium: 127 mmol/L — ABNORMAL LOW (ref 134–144)
TOTAL PROTEIN: 7.1 g/dL (ref 6.0–8.5)

## 2017-07-05 LAB — LIPID PANEL
CHOLESTEROL TOTAL: 149 mg/dL (ref 100–199)
Chol/HDL Ratio: 2 ratio (ref 0.0–4.4)
HDL: 73 mg/dL (ref >39)
LDL Calculated: 63 mg/dL (ref 0–99)
TRIGLYCERIDES: 66 mg/dL (ref 0–149)
VLDL Cholesterol Cal: 13 mg/dL (ref 5–40)

## 2017-07-05 MED ORDER — DULOXETINE HCL 60 MG PO CPEP: ORAL_CAPSULE | ORAL | 1 refills | Status: DC

## 2017-07-05 MED ORDER — OMEPRAZOLE 20 MG PO CPDR: DELAYED_RELEASE_CAPSULE | ORAL | 1 refills | Status: DC

## 2017-07-05 MED ORDER — METOPROLOL SUCCINATE ER 100 MG PO TB24
ORAL_TABLET | ORAL | 1 refills | Status: DC
Start: 2017-07-05 — End: 2018-01-14

## 2017-07-05 MED ORDER — AMLODIPINE BESYLATE 5 MG PO TABS: 5.0000 mg | ORAL_TABLET | Freq: Every day | ORAL | 1 refills | Status: DC

## 2017-07-05 MED ORDER — ATORVASTATIN CALCIUM 40 MG PO TABS: 40.0000 mg | ORAL_TABLET | Freq: Every day | ORAL | 1 refills | Status: DC

## 2017-07-05 MED ORDER — LISINOPRIL-HYDROCHLOROTHIAZIDE 20-12.5 MG PO TABS: 2.0000 | ORAL_TABLET | Freq: Every day | ORAL | 1 refills | Status: DC

## 2017-07-05 NOTE — Addendum Note (Signed)
Addended by: Chevis Pretty on: 07/05/2017 12:28 PM   Modules accepted: Orders

## 2017-07-05 NOTE — Progress Notes (Signed)
Subjective:    Patient ID: Amanda Kelly, female    DOB: 1961/01/13, 57 y.o.   MRN: [id number]  HPI  Amanda Kelly is here today for follow up of chronic medical problem.  Outpatient Encounter Medications as of 07/05/2017  Medication Sig  . amLODipine (NORVASC) 5 MG tablet TAKE 1 TABLET BY MOUTH EVERY DAY  . atorvastatin (LIPITOR) 40 MG tablet TAKE 1 TABLET (40 MG TOTAL) BY MOUTH DAILY.  . DULoxetine (CYMBALTA) 60 MG capsule TAKE 1 CAPSULE BY MOUTH EVERY DAY  . lisinopril-hydrochlorothiazide (PRINZIDE,ZESTORETIC) 20-12.5 MG tablet TAKE 2 TABLETS BY MOUTH DAILY.  . metoprolol succinate (TOPROL-XL) 100 MG 24 hr tablet TAKE 1 TABLET (100 MG TOTAL) BY MOUTH DAILY. TAKE WITH OR IMMEDIATELY FOLLOWING A MEAL.  Marland Kitchen omeprazole (PRILOSEC) 20 MG capsule TAKE 1 CAPSULE BY MOUTH EVERY DAY     1. Essential hypertension  No c/o chest pain, sob or headache. Does not check blood pressure at home. BP Readings from Last 3 Encounters:  01/10/17 138/87  12/14/16 (!) 167/101  04/21/16 (!) 195/118     2. Gastroesophageal reflux disease without esophagitis  Takes omeprazole daily to control symptoms  3. Hyperlipidemia with target LDL less than 100  Does not watch diet  4. BMI 40.0-44.9, adult (HCC)  Weight is actually down 7 lbs  5. ANXIETY DEPRESSION  Is currently on cymbalta which she says works well for her. No c/o side effects. 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 She has had genetic testing done on the past, which was negative for gene. They are watching areas in  breast closely.    New complaints: None today  Social history: Lives with husband and 3 adopted boys who keep her busy. She does work part time.    Review of Systems  Constitutional: Negative for activity change and appetite change.  HENT: Negative.   Eyes: Negative for pain.  Respiratory: Negative for shortness of breath.   Cardiovascular: Negative for chest pain, palpitations and leg swelling.  Gastrointestinal: Negative for abdominal pain.  Endocrine: Negative for polydipsia.  Genitourinary: Negative.   Skin: Negative for rash.  Neurological: Negative for dizziness, weakness and headaches.  Hematological: Does not bruise/bleed easily.  Psychiatric/Behavioral: Negative.   All other systems reviewed and are negative.      Objective:   Physical Exam  Constitutional: She is oriented to person, place, and time. She appears well-developed and well-nourished.  HENT:  Nose: Nose normal.  Mouth/Throat: Oropharynx is clear and moist.  Eyes: EOM are normal.  Neck: Trachea normal, normal range of motion and full passive range of motion without pain. Neck supple. No JVD present. Carotid bruit is not present. No thyromegaly present.  Cardiovascular: Normal rate, regular rhythm, normal heart sounds and intact distal pulses. Exam reveals no gallop and no friction rub.  No murmur heard. Pulmonary/Chest: Effort normal and breath sounds normal.  Abdominal: Soft. Bowel sounds are normal.  She exhibits no distension and no mass. There is no tenderness.  Musculoskeletal: Normal range of motion.  Lymphadenopathy:    She has no cervical adenopathy.  Neurological: She is alert and oriented to person, place, and time. She has normal reflexes.  Skin: Skin is warm and dry.  Psychiatric: She has a normal mood and affect. Her behavior is normal. Judgment and thought content normal.   BP 135/82   Pulse 86   Temp (!) 96.5 F (35.8 C) (Oral)   Ht 5\' 5"  (1.651 m)   Wt 242 lb (109.8 kg)   BMI  40.27 kg/m        Assessment & Plan:  1. Essential hypertension Low sodium diet - amLODipine (NORVASC) 5 MG tablet; Take 1 tablet (5 mg total) by mouth daily.  Dispense: 90 tablet; Refill: 1 - lisinopril-hydrochlorothiazide (PRINZIDE,ZESTORETIC) 20-12.5 MG tablet; Take 2 tablets by mouth daily.  Dispense: 180 tablet; Refill: 1  2. Gastroesophageal reflux disease without esophagitis Avoid spicy foods Do not eat 2 hours prior to bedtime - omeprazole (PRILOSEC) 20 MG capsule; TAKE 1 CAPSULE BY MOUTH EVERY DAY  Dispense: 90 capsule; Refill: 1  3. Hyperlipidemia with target LDL less than 100 Low fat diet - atorvastatin (LIPITOR) 40 MG tablet; Take 1 tablet (40 mg total) by mouth daily.  Dispense: 90 tablet; Refill: 1  4. BMI 40.0-44.9, adult ([place]) Discussed diet and exercise for person with BMI >25 Will recheck weight in 3-6 months  5. ANXIETY DEPRESSION Stress management - DULoxetine (CYMBALTA) 60 MG capsule; TAKE 1 CAPSULE BY MOUTH EVERY DAY  Dispense: 90 capsule; Refill: 1  6. Benign phyllodes tumor of left breast Will start yearly routine mammograms in august  7. Tachycardia Avoid caffeine - metoprolol succinate (TOPROL-XL) 100 MG 24 hr tablet; TAKE 1 TABLET (100 MG TOTAL) BY MOUTH DAILY. TAKE WITH OR IMMEDIATELY FOLLOWING A MEAL.  Dispense: 90 tablet; Refill: 1    Labs pending Health maintenance reviewed Diet and exercise encouraged Continue all meds Follow up  In 6 months   Garner, FNP

## 2017-07-05 NOTE — Patient Instructions (Signed)
Stress and Stress Management Stress is a normal reaction to life events. It is what you feel when life demands more than you are used to or more than you can handle. Some stress can be useful. For example, the stress reaction can help you catch the last bus of the day, study for a test, or meet a deadline at work. But stress that occurs too often or for too long can cause problems. It can affect your emotional health and interfere with relationships and normal daily activities. Too much stress can weaken your immune system and increase your risk for physical illness. If you already have a medical problem, stress can make it worse. What are the causes? All sorts of life events may cause stress. An event that causes stress for one person may not be stressful for another person. Major life events commonly cause stress. These may be positive or negative. Examples include losing your job, moving into a new home, getting married, having a baby, or losing a loved one. Less obvious life events may also cause stress, especially if they occur day after day or in combination. Examples include working long hours, driving in traffic, caring for children, being in debt, or being in a difficult relationship. What are the signs or symptoms? Stress may cause emotional symptoms including, the following:  Anxiety. This is feeling worried, afraid, on edge, overwhelmed, or out of control.  Anger. This is feeling irritated or impatient.  Depression. This is feeling sad, down, helpless, or guilty.  Difficulty focusing, remembering, or making decisions.  Stress may cause physical symptoms, including the following:  Aches and pains. These may affect your head, neck, back, stomach, or other areas of your body.  Tight muscles or clenched jaw.  Low energy or trouble sleeping.  Stress may cause unhealthy behaviors, including the following:  Eating to feel better (overeating) or skipping meals.  Sleeping too little,  too much, or both.  Working too much or putting off tasks (procrastination).  Smoking, drinking alcohol, or using drugs to feel better.  How is this diagnosed? Stress is diagnosed through an assessment by your health care provider. Your health care provider will ask questions about your symptoms and any stressful life events.Your health care provider will also ask about your medical history and may order blood tests or other tests. Certain medical conditions and medicine can cause physical symptoms similar to stress. Mental illness can cause emotional symptoms and unhealthy behaviors similar to stress. Your health care provider may refer you to a mental health professional for further evaluation. How is this treated? Stress management is the recommended treatment for stress.The goals of stress management are reducing stressful life events and coping with stress in healthy ways. Techniques for reducing stressful life events include the following:  Stress identification. Self-monitor for stress and identify what causes stress for you. These skills may help you to avoid some stressful events.  Time management. Set your priorities, keep a calendar of events, and learn to say "no." These tools can help you avoid making too many commitments.  Techniques for coping with stress include the following:  Rethinking the problem. Try to think realistically about stressful events rather than ignoring them or overreacting. Try to find the positives in a stressful situation rather than focusing on the negatives.  Exercise. Physical exercise can release both physical and emotional tension. The key is to find a form of exercise you enjoy and do it regularly.  Relaxation techniques. These relax the body and  mind. Examples include yoga, meditation, tai chi, biofeedback, deep breathing, progressive muscle relaxation, listening to music, being out in nature, journaling, and other hobbies. Again, the key is to find  one or more that you enjoy and can do regularly.  Healthy lifestyle. Eat a balanced diet, get plenty of sleep, and do not smoke. Avoid using alcohol or drugs to relax.  Strong support network. Spend time with family, friends, or other people you enjoy being around.Express your feelings and talk things over with someone you trust.  Counseling or talktherapy with a mental health professional may be helpful if you are having difficulty managing stress on your own. Medicine is typically not recommended for the treatment of stress.Talk to your health care provider if you think you need medicine for symptoms of stress. Follow these instructions at home:  Keep all follow-up visits as directed by your health care provider.  Take all medicines as directed by your health care provider. Contact a health care provider if:  Your symptoms get worse or you start having new symptoms.  You feel overwhelmed by your problems and can no longer manage them on your own. Get help right away if:  You feel like hurting yourself or someone else. This information is not intended to replace advice given to you by your health care provider. Make sure you discuss any questions you have with your health care provider. Document Released: 09/06/2000 Document Revised: 08/19/2015 Document Reviewed: 11/05/2012 Elsevier Interactive Patient Education  2017 [employer]  

## 2017-08-01 ENCOUNTER — Ambulatory Visit
Admission: RE | Admit: 2017-08-01 | Discharge: 2017-08-01 | Disposition: A | Discharge status: Home / Self Care | Payer: BLUE CROSS/BLUE SHIELD | Source: Ambulatory Visit | Attending: Nurse Practitioner | Admitting: Nurse Practitioner

## 2017-08-01 DIAGNOSIS — Z1231 Encounter for screening mammogram for malignant neoplasm of breast: Secondary | ICD-10-CM | POA: Diagnosis not present

## 2017-08-01 DIAGNOSIS — Z139 Encounter for screening, unspecified: Secondary | ICD-10-CM

## 2017-08-02 ENCOUNTER — Other Ambulatory Visit: Payer: Self-pay | Admitting: Nurse Practitioner

## 2017-08-02 DIAGNOSIS — R921 Mammographic calcification found on diagnostic imaging of breast: Secondary | ICD-10-CM

## 2017-08-06 ENCOUNTER — Ambulatory Visit
Admission: RE | Admit: 2017-08-06 | Discharge: 2017-08-06 | Disposition: A | Discharge status: Home / Self Care | Payer: BLUE CROSS/BLUE SHIELD | Source: Ambulatory Visit | Attending: Nurse Practitioner | Admitting: Nurse Practitioner

## 2017-08-06 DIAGNOSIS — R921 Mammographic calcification found on diagnostic imaging of breast: Secondary | ICD-10-CM | POA: Diagnosis not present

## 2017-12-08 ENCOUNTER — Other Ambulatory Visit: Payer: Self-pay | Admitting: Nurse Practitioner

## 2017-12-08 DIAGNOSIS — I10 Essential (primary) hypertension: Secondary | ICD-10-CM

## 2017-12-10 NOTE — Telephone Encounter (Signed)
OV 01/14/18 

## 2017-12-15 ENCOUNTER — Other Ambulatory Visit: Payer: Self-pay | Admitting: Nurse Practitioner

## 2017-12-15 DIAGNOSIS — E785 Hyperlipidemia, unspecified: Secondary | ICD-10-CM

## 2018-01-14 ENCOUNTER — Ambulatory Visit: Payer: BLUE CROSS/BLUE SHIELD | Admitting: Nurse Practitioner

## 2018-01-14 ENCOUNTER — Encounter: Payer: Self-pay | Admitting: Nurse Practitioner

## 2018-01-14 VITALS — BP 138/82 | HR 91 | Temp 97.0°F | Ht 65.0 in | Wt 241.0 lb

## 2018-01-14 DIAGNOSIS — Z87891 Personal history of nicotine dependence: Secondary | ICD-10-CM

## 2018-01-14 DIAGNOSIS — Z6841 Body Mass Index (BMI) 40.0 and over, adult: Secondary | ICD-10-CM | POA: Diagnosis not present

## 2018-01-14 DIAGNOSIS — F341 Dysthymic disorder: Secondary | ICD-10-CM

## 2018-01-14 DIAGNOSIS — E785 Hyperlipidemia, unspecified: Secondary | ICD-10-CM | POA: Diagnosis not present

## 2018-01-14 DIAGNOSIS — R000 Tachycardia, unspecified: Secondary | ICD-10-CM

## 2018-01-14 DIAGNOSIS — I10 Essential (primary) hypertension: Secondary | ICD-10-CM

## 2018-01-14 DIAGNOSIS — K219 Gastro-esophageal reflux disease without esophagitis: Secondary | ICD-10-CM

## 2018-01-14 DIAGNOSIS — Z23 Encounter for immunization: Secondary | ICD-10-CM

## 2018-01-14 MED ORDER — OMEPRAZOLE 20 MG PO CPDR: DELAYED_RELEASE_CAPSULE | ORAL | 1 refills | Status: DC

## 2018-01-14 MED ORDER — AMLODIPINE BESYLATE 5 MG PO TABS: 5.0000 mg | ORAL_TABLET | Freq: Every day | ORAL | 1 refills | Status: DC

## 2018-01-14 MED ORDER — ATORVASTATIN CALCIUM 40 MG PO TABS: 40.0000 mg | ORAL_TABLET | Freq: Every day | ORAL | 1 refills | Status: DC

## 2018-01-14 MED ORDER — DULOXETINE HCL 60 MG PO CPEP
ORAL_CAPSULE | ORAL | 1 refills | Status: DC
Start: 2018-01-14 — End: 2018-07-23

## 2018-01-14 MED ORDER — LISINOPRIL-HYDROCHLOROTHIAZIDE 20-12.5 MG PO TABS: 2.0000 | ORAL_TABLET | Freq: Every day | ORAL | 1 refills | Status: DC

## 2018-01-14 MED ORDER — METOPROLOL SUCCINATE ER 100 MG PO TB24: ORAL_TABLET | ORAL | 1 refills | Status: DC

## 2018-01-14 NOTE — Patient Instructions (Signed)
Stress and Stress Management Stress is a normal reaction to life events. It is what you feel when life demands more than you are used to or more than you can handle. Some stress can be useful. For example, the stress reaction can help you catch the last bus of the day, study for a test, or meet a deadline at work. But stress that occurs too often or for too long can cause problems. It can affect your emotional health and interfere with relationships and normal daily activities. Too much stress can weaken your immune system and increase your risk for physical illness. If you already have a medical problem, stress can make it worse. What are the causes? All sorts of life events may cause stress. An event that causes stress for one person may not be stressful for another person. Major life events commonly cause stress. These may be positive or negative. Examples include losing your job, moving into a new home, getting married, having a baby, or losing a loved one. Less obvious life events may also cause stress, especially if they occur day after day or in combination. Examples include working long hours, driving in traffic, caring for children, being in debt, or being in a difficult relationship. What are the signs or symptoms? Stress may cause emotional symptoms including, the following:  Anxiety. This is feeling worried, afraid, on edge, overwhelmed, or out of control.  Anger. This is feeling irritated or impatient.  Depression. This is feeling sad, down, helpless, or guilty.  Difficulty focusing, remembering, or making decisions.  Stress may cause physical symptoms, including the following:  Aches and pains. These may affect your head, neck, back, stomach, or other areas of your body.  Tight muscles or clenched jaw.  Low energy or trouble sleeping.  Stress may cause unhealthy behaviors, including the following:  Eating to feel better (overeating) or skipping meals.  Sleeping too little,  too much, or both.  Working too much or putting off tasks (procrastination).  Smoking, drinking alcohol, or using drugs to feel better.  How is this diagnosed? Stress is diagnosed through an assessment by your health care provider. Your health care provider will ask questions about your symptoms and any stressful life events.Your health care provider will also ask about your medical history and may order blood tests or other tests. Certain medical conditions and medicine can cause physical symptoms similar to stress. Mental illness can cause emotional symptoms and unhealthy behaviors similar to stress. Your health care provider may refer you to a mental health professional for further evaluation. How is this treated? Stress management is the recommended treatment for stress.The goals of stress management are reducing stressful life events and coping with stress in healthy ways. Techniques for reducing stressful life events include the following:  Stress identification. Self-monitor for stress and identify what causes stress for you. These skills may help you to avoid some stressful events.  Time management. Set your priorities, keep a calendar of events, and learn to say "no." These tools can help you avoid making too many commitments.  Techniques for coping with stress include the following:  Rethinking the problem. Try to think realistically about stressful events rather than ignoring them or overreacting. Try to find the positives in a stressful situation rather than focusing on the negatives.  Exercise. Physical exercise can release both physical and emotional tension. The key is to find a form of exercise you enjoy and do it regularly.  Relaxation techniques. These relax the body and  mind. Examples include yoga, meditation, tai chi, biofeedback, deep breathing, progressive muscle relaxation, listening to music, being out in nature, journaling, and other hobbies. Again, the key is to find  one or more that you enjoy and can do regularly.  Healthy lifestyle. Eat a balanced diet, get plenty of sleep, and do not smoke. Avoid using alcohol or drugs to relax.  Strong support network. Spend time with family, friends, or other people you enjoy being around.Express your feelings and talk things over with someone you trust.  Counseling or talktherapy with a mental health professional may be helpful if you are having difficulty managing stress on your own. Medicine is typically not recommended for the treatment of stress.Talk to your health care provider if you think you need medicine for symptoms of stress. Follow these instructions at home:  Keep all follow-up visits as directed by your health care provider.  Take all medicines as directed by your health care provider. Contact a health care provider if:  Your symptoms get worse or you start having new symptoms.  You feel overwhelmed by your problems and can no longer manage them on your own. Get help right away if:  You feel like hurting yourself or someone else. This information is not intended to replace advice given to you by your health care provider. Make sure you discuss any questions you have with your health care provider. Document Released: 09/06/2000 Document Revised: 08/19/2015 Document Reviewed: 11/05/2012 Elsevier Interactive Patient Education  2017 [employer]  

## 2018-01-14 NOTE — Progress Notes (Signed)
Subjective:    Patient ID: Amanda Kelly, female    DOB: 1960-11-02, 57 y.o.   MRN: [id number]   Chief Complaint: medical management of chronic issues  HPI:  1. Essential hypertension  No c/o chest pain, sob or headache. Does not check blood pressure at home. BP Readings from Last 3 Encounters:  07/05/17 135/82  01/10/17 138/87  12/14/16 (!) 167/101     2. BMI 40.0-44.9, adult (HCC)  No recent weight changes  3. ANXIETY DEPRESSION  currently on cymbalta daily- which works well Depression screen [facility] 2/9 01/14/2018 07/05/2017 01/10/2017  Decreased Interest 0 0 0  Down, Depressed, Hopeless 0 0 0  PHQ - 2 Score 0 0 0  Altered sleeping - - -  Tired, decreased energy - - -  Change in appetite - - -  Feeling bad or failure about yourself  - - -  Trouble concentrating - - -  Moving slowly or fidgety/restless - - -  Suicidal thoughts - - -  PHQ-9 Score - - -     4. Hx of smoking  Still not smoking  5.      GERD           Is on omeprazole daily which works well to keep symptoms under                 control 6.       Hyperlipidemia           Is on lipitor daily- does not watch diet nor does she exercise.   Outpatient Encounter Medications as of 01/14/2018  Medication Sig  . amLODipine (NORVASC) 5 MG tablet Take 1 tablet (5 mg total) by mouth daily.  Marland Kitchen atorvastatin (LIPITOR) 40 MG tablet TAKE 1 TABLET BY MOUTH EVERY DAY  . DULoxetine (CYMBALTA) 60 MG capsule TAKE 1 CAPSULE BY MOUTH EVERY DAY  . lisinopril-hydrochlorothiazide (PRINZIDE,ZESTORETIC) 20-12.5 MG tablet TAKE 2 TABLETS BY MOUTH EVERY DAY  . metoprolol succinate (TOPROL-XL) 100 MG 24 hr tablet TAKE 1 TABLET (100 MG TOTAL) BY MOUTH DAILY. TAKE WITH OR IMMEDIATELY FOLLOWING A MEAL.  Marland Kitchen omeprazole (PRILOSEC) 20 MG capsule TAKE 1 CAPSULE BY MOUTH EVERY DAY       New complaints: None today  Social history: Lives with her husband and 3 sons.   Review of Systems  Constitutional: Negative for activity change and  appetite change.  HENT: Negative.   Eyes: Negative for pain.  Respiratory: Negative for shortness of breath.   Cardiovascular: Negative for chest pain, palpitations and leg swelling.  Gastrointestinal: Negative for abdominal pain.  Endocrine: Negative for polydipsia.  Genitourinary: Negative.   Skin: Negative for rash.  Neurological: Negative for dizziness, weakness and headaches.  Hematological: Does not bruise/bleed easily.  Psychiatric/Behavioral: Negative.   All other systems reviewed and are negative.      Objective:   Physical Exam  Constitutional: She is oriented to person, place, and time. She appears well-developed and well-nourished. No distress.  HENT:  Head: Normocephalic.  Nose: Nose normal.  Mouth/Throat: Oropharynx is clear and moist.  Eyes: Pupils are equal, round, and reactive to light. EOM are normal.  Neck: Normal range of motion. Neck supple. No JVD present. Carotid bruit is not present.  Cardiovascular: Normal rate, regular rhythm, normal heart sounds and intact distal pulses.  Pulmonary/Chest: Effort normal and breath sounds normal. No respiratory distress. She has no wheezes. She has no rales. She exhibits no tenderness.  Abdominal: Soft. Normal appearance, normal aorta and  bowel sounds are normal. She exhibits no distension, no abdominal bruit, no pulsatile midline mass and no mass. There is no splenomegaly or hepatomegaly. There is no tenderness.  Musculoskeletal: Normal range of motion. She exhibits no edema.  Lymphadenopathy:    She has no cervical adenopathy.  Neurological: She is alert and oriented to person, place, and time. She has normal reflexes.  Skin: Skin is warm and dry.  Psychiatric: She has a normal mood and affect. Her behavior is normal. Judgment and thought content normal.  Nursing note and vitals reviewed.  BP 138/82   Pulse 91   Temp (!) 97 F (36.1 C)   Ht _0  (1.651 m)   Wt 241 lb (109.3 kg)   BMI 40.10 kg/m         Assessment & Plan:  Amanda Kelly comes in today with chief complaint of Follow-up   Diagnosis and orders addressed:  1. Essential hypertension Low sodium diet - lisinopril-hydrochlorothiazide (PRINZIDE,ZESTORETIC) 20-12.5 MG tablet; Take 2 tablets by mouth daily.  Dispense: 180 tablet; Refill: 1 - amLODipine (NORVASC) 5 MG tablet; Take 1 tablet (5 mg total) by mouth daily.  Dispense: 90 tablet; Refill: 1 - CMP14+EGFR  2. BMI 40.0-44.9, adult ([place]) Discussed diet and exercise for person with BMI >25 Will recheck weight in 3-6 months  3. ANXIETY DEPRESSION Stress management - DULoxetine (CYMBALTA) 60 MG capsule; TAKE 1 CAPSULE BY MOUTH EVERY DAY  Dispense: 90 capsule; Refill: 1  4. Hx of smoking Continue to avoid cigarette smoke  5. Need for immunization against influenza - Flu Vaccine QUAD 36+ mos IM  6. Gastroesophageal reflux disease without esophagitis Avoid spicy foods Do not eat 2 hours prior to bedtime - omeprazole (PRILOSEC) 20 MG capsule; TAKE 1 CAPSULE BY MOUTH EVERY DAY  Dispense: 90 capsule; Refill: 1  7. Hyperlipidemia with target LDL less than 100 Low fat diet - atorvastatin (LIPITOR) 40 MG tablet; Take 1 tablet (40 mg total) by mouth daily.  Dispense: 90 tablet; Refill: 1 - Lipid panel  8. Tachycardia - metoprolol succinate (TOPROL-XL) 100 MG 24 hr tablet; TAKE 1 TABLET (100 MG TOTAL) BY MOUTH DAILY. TAKE WITH OR IMMEDIATELY FOLLOWING A MEAL.  Dispense: 90 tablet; Refill: 1   Labs pending Health Maintenance reviewed Diet and exercise encouraged  Follow up plan: 6 months   Mary-Margaret Hassell Done, FNP

## 2018-01-15 LAB — CMP14+EGFR
ALT: 40 IU/L — AB (ref 0–32)
AST: 34 IU/L (ref 0–40)
Albumin/Globulin Ratio: 2.2 (ref 1.2–2.2)
Albumin: 4.8 g/dL (ref 3.5–5.5)
Alkaline Phosphatase: 96 IU/L (ref 39–117)
BILIRUBIN TOTAL: 0.4 mg/dL (ref 0.0–1.2)
BUN/Creatinine Ratio: 19 (ref 9–23)
BUN: 10 mg/dL (ref 6–24)
CHLORIDE: 87 mmol/L — AB (ref 96–106)
CO2: 22 mmol/L (ref 20–29)
CREATININE: 0.53 mg/dL — AB (ref 0.57–1.00)
Calcium: 9.9 mg/dL (ref 8.7–10.2)
GFR calc non Af Amer: 107 mL/min/{1.73_m2} (ref >59)
GFR, EST AFRICAN AMERICAN: 124 mL/min/{1.73_m2} (ref >59)
GLUCOSE: 94 mg/dL (ref 65–99)
Globulin, Total: 2.2 g/dL (ref 1.5–4.5)
Potassium: 4.5 mmol/L (ref 3.5–5.2)
Sodium: 127 mmol/L — ABNORMAL LOW (ref 134–144)
TOTAL PROTEIN: 7 g/dL (ref 6.0–8.5)

## 2018-01-15 LAB — LIPID PANEL
Chol/HDL Ratio: 1.9 ratio (ref 0.0–4.4)
Cholesterol, Total: 155 mg/dL (ref 100–199)
HDL: 80 mg/dL (ref >39)
LDL Calculated: 58 mg/dL (ref 0–99)
Triglycerides: 83 mg/dL (ref 0–149)
VLDL CHOLESTEROL CAL: 17 mg/dL (ref 5–40)

## 2018-01-27 ENCOUNTER — Other Ambulatory Visit: Payer: Self-pay | Admitting: Nurse Practitioner

## 2018-01-27 DIAGNOSIS — E785 Hyperlipidemia, unspecified: Secondary | ICD-10-CM

## 2018-04-11 ENCOUNTER — Encounter: Payer: Self-pay | Admitting: *Deleted

## 2018-07-15 ENCOUNTER — Other Ambulatory Visit: Payer: Self-pay | Admitting: Nurse Practitioner

## 2018-07-15 DIAGNOSIS — Z1231 Encounter for screening mammogram for malignant neoplasm of breast: Secondary | ICD-10-CM

## 2018-07-16 ENCOUNTER — Ambulatory Visit: Payer: BLUE CROSS/BLUE SHIELD | Admitting: Nurse Practitioner

## 2018-07-23 ENCOUNTER — Other Ambulatory Visit: Payer: Self-pay

## 2018-07-23 ENCOUNTER — Ambulatory Visit (INDEPENDENT_AMBULATORY_CARE_PROVIDER_SITE_OTHER): Payer: BLUE CROSS/BLUE SHIELD | Admitting: Nurse Practitioner

## 2018-07-23 ENCOUNTER — Encounter: Payer: Self-pay | Admitting: Nurse Practitioner

## 2018-07-23 DIAGNOSIS — Z87891 Personal history of nicotine dependence: Secondary | ICD-10-CM

## 2018-07-23 DIAGNOSIS — Z6841 Body Mass Index (BMI) 40.0 and over, adult: Secondary | ICD-10-CM

## 2018-07-23 DIAGNOSIS — K219 Gastro-esophageal reflux disease without esophagitis: Secondary | ICD-10-CM | POA: Diagnosis not present

## 2018-07-23 DIAGNOSIS — F341 Dysthymic disorder: Secondary | ICD-10-CM

## 2018-07-23 DIAGNOSIS — I10 Essential (primary) hypertension: Secondary | ICD-10-CM | POA: Diagnosis not present

## 2018-07-23 DIAGNOSIS — R000 Tachycardia, unspecified: Secondary | ICD-10-CM

## 2018-07-23 DIAGNOSIS — E782 Mixed hyperlipidemia: Secondary | ICD-10-CM | POA: Diagnosis not present

## 2018-07-23 MED ORDER — METOPROLOL SUCCINATE ER 100 MG PO TB24: ORAL_TABLET | ORAL | 1 refills | Status: DC

## 2018-07-23 MED ORDER — DULOXETINE HCL 60 MG PO CPEP
ORAL_CAPSULE | ORAL | 1 refills | Status: DC
Start: 2018-07-23 — End: 2018-10-22

## 2018-07-23 MED ORDER — LISINOPRIL-HYDROCHLOROTHIAZIDE 20-12.5 MG PO TABS: 2.0000 | ORAL_TABLET | Freq: Every day | ORAL | 1 refills | Status: DC

## 2018-07-23 MED ORDER — ATORVASTATIN CALCIUM 40 MG PO TABS: 40.0000 mg | ORAL_TABLET | Freq: Every day | ORAL | 1 refills | Status: DC

## 2018-07-23 MED ORDER — OMEPRAZOLE 20 MG PO CPDR
DELAYED_RELEASE_CAPSULE | ORAL | 1 refills | Status: DC
Start: 2018-07-23 — End: 2018-10-22

## 2018-07-23 MED ORDER — AMLODIPINE BESYLATE 5 MG PO TABS: 5.0000 mg | ORAL_TABLET | Freq: Every day | ORAL | 1 refills | Status: DC

## 2018-07-23 NOTE — Progress Notes (Signed)
Patient ID: Amanda Kelly, female   DOB: 1960-11-11, 58 y.o.   MRN: [id number]     Virtual Visit via telephone Note  I connected with Amanda Kelly on 07/23/18 at 8:05 AM by telephone and verified that I am speaking with the correct person using two identifiers. Amanda Kelly is currently located at home and no one is currently with her during visit. The provider, Mary-Margaret Hassell Done, FNP is located in their office at time of visit.  I discussed the limitations, risks, security and privacy concerns of performing an evaluation and management service by telephone and the availability of in person appointments. I also discussed with the patient that there may be a patient responsible charge related to this service. The patient expressed understanding and agreed to proceed.   History and Present Illness:   Chief Complaint: Medical Management of Chronic Issues    HPI:  1. Essential hypertension No c/o chest pain, sob or headache. Does not check blood pressure at home. BP Readings from Last 3 Encounters:  01/14/18 138/82  07/05/17 135/82  01/10/17 138/87     2. Mixed hyperlipidemia Does not really watch diet and does very little exercise.  3. GERD without esophagitis Omeprazole 20mg  daily and works well to keep symptoms under control  4. ANXIETY DEPRESSION Takes cymbalta daily and is doing well. Works well for her anxiety  5. Hx of smoking Stopped 3 years ago  6. BMI 40.0-44.9, adult ([place]) No weight chnages    Outpatient Encounter Medications as of 07/23/2018  Medication Sig  . amLODipine (NORVASC) 5 MG tablet Take 1 tablet (5 mg total) by mouth daily.  Marland Kitchen atorvastatin (LIPITOR) 40 MG tablet Take 1 tablet (40 mg total) by mouth daily.  . DULoxetine (CYMBALTA) 60 MG capsule TAKE 1 CAPSULE BY MOUTH EVERY DAY  . lisinopril-hydrochlorothiazide (PRINZIDE,ZESTORETIC) 20-12.5 MG tablet Take 2 tablets by mouth daily.  . metoprolol succinate (TOPROL-XL) 100 MG 24 hr tablet TAKE 1  TABLET (100 MG TOTAL) BY MOUTH DAILY. TAKE WITH OR IMMEDIATELY FOLLOWING A MEAL.  Marland Kitchen omeprazole (PRILOSEC) 20 MG capsule TAKE 1 CAPSULE BY MOUTH EVERY DAY      New complaints: None today  Social history: Lives with husband and 3 adopted kids       Review of Systems  Constitutional: Negative for diaphoresis and weight loss.  Eyes: Negative for blurred vision, double vision and pain.  Respiratory: Negative for shortness of breath.   Cardiovascular: Negative for chest pain, palpitations, orthopnea and leg swelling.  Gastrointestinal: Negative for abdominal pain.  Skin: Negative for rash.  Neurological: Negative for dizziness, sensory change, loss of consciousness, weakness and headaches.  Endo/Heme/Allergies: Negative for polydipsia. Does not bruise/bleed easily.  Psychiatric/Behavioral: Negative for memory loss. The patient does not have insomnia.   All other systems reviewed and are negative.    Observations/Objective: Alert and oriented - answers all questions appropriately No distress in voice  Assessment and Plan: Amanda Kelly comes in today with chief complaint of Medical Management of Chronic Issues   Diagnosis and orders addressed:  1. Essential hypertension Low salt diet - amLODipine (NORVASC) 5 MG tablet; Take 1 tablet (5 mg total) by mouth daily.  Dispense: 90 tablet; Refill: 1 - lisinopril-hydrochlorothiazide (ZESTORETIC) 20-12.5 MG tablet; Take 2 tablets by mouth daily.  Dispense: 180 tablet; Refill: 1  2. Mixed hyperlipidemia Low fat diet - atorvastatin (LIPITOR) 40 MG tablet; Take 1 tablet (40 mg total) by mouth daily.  Dispense: 90 tablet; Refill: 1  3. GERD without esophagitis Avoid spicy foods Do not eat 2 hours prior to bedtime - omeprazole (PRILOSEC) 20 MG capsule; TAKE 1 CAPSULE BY MOUTH EVERY DAY  Dispense: 90 capsule; Refill: 1  4. ANXIETY DEPRESSION Stress management - DULoxetine (CYMBALTA) 60 MG capsule; TAKE 1 CAPSULE BY MOUTH EVERY DAY   Dispense: 90 capsule; Refill: 1  5. Hx of smoking Continue smoking cessation  6. BMI 40.0-44.9, adult ([place]) Discussed diet and exercise for person with BMI >25 Will recheck weight in 3-6 months  7. Tachycardia - metoprolol succinate (TOPROL-XL) 100 MG 24 hr tablet; TAKE 1 TABLET (100 MG TOTAL) BY MOUTH DAILY. TAKE WITH OR IMMEDIATELY FOLLOWING A MEAL.  Dispense: 90 tablet; Refill: 1   Previous lab results reviewed Health Maintenance reviewed Diet and exercise encouraged   Follow Up Instructions: 3 months    I discussed the assessment and treatment plan with the patient. The patient was provided an opportunity to ask questions and all were answered. The patient agreed with the plan and demonstrated an understanding of the instructions.   The patient was advised to call back or seek an in-person evaluation if the symptoms worsen or if the condition fails to improve as anticipated.  The above assessment and management plan was discussed with the patient. The patient verbalized understanding of and has agreed to the management plan. Patient is aware to call the clinic if symptoms persist or worsen. Patient is aware when to return to the clinic for a follow-up visit. Patient educated on when it is appropriate to go to the emergency department.    I provided 15 minutes of non-face-to-face time during this encounter.    Mary-Margaret Hassell Done, FNP

## 2018-09-16 ENCOUNTER — Other Ambulatory Visit: Payer: Self-pay

## 2018-09-16 ENCOUNTER — Ambulatory Visit
Admission: RE | Admit: 2018-09-16 | Discharge: 2018-09-16 | Disposition: A | Discharge status: Home / Self Care | Payer: BC Managed Care – PPO | Source: Ambulatory Visit | Attending: Nurse Practitioner | Admitting: Nurse Practitioner

## 2018-09-16 DIAGNOSIS — Z1231 Encounter for screening mammogram for malignant neoplasm of breast: Secondary | ICD-10-CM

## 2018-10-21 ENCOUNTER — Other Ambulatory Visit: Payer: Self-pay

## 2018-10-22 ENCOUNTER — Encounter: Payer: Self-pay | Admitting: Nurse Practitioner

## 2018-10-22 ENCOUNTER — Ambulatory Visit: Payer: BLUE CROSS/BLUE SHIELD | Admitting: Nurse Practitioner

## 2018-10-22 VITALS — BP 124/78 | HR 88 | Temp 96.8°F | Ht 65.0 in | Wt 244.0 lb

## 2018-10-22 DIAGNOSIS — Z6841 Body Mass Index (BMI) 40.0 and over, adult: Secondary | ICD-10-CM

## 2018-10-22 DIAGNOSIS — I10 Essential (primary) hypertension: Secondary | ICD-10-CM | POA: Diagnosis not present

## 2018-10-22 DIAGNOSIS — E782 Mixed hyperlipidemia: Secondary | ICD-10-CM | POA: Diagnosis not present

## 2018-10-22 DIAGNOSIS — K219 Gastro-esophageal reflux disease without esophagitis: Secondary | ICD-10-CM

## 2018-10-22 DIAGNOSIS — F341 Dysthymic disorder: Secondary | ICD-10-CM | POA: Diagnosis not present

## 2018-10-22 DIAGNOSIS — R000 Tachycardia, unspecified: Secondary | ICD-10-CM

## 2018-10-22 MED ORDER — ATORVASTATIN CALCIUM 40 MG PO TABS: 40.0000 mg | ORAL_TABLET | Freq: Every day | ORAL | 1 refills | Status: DC

## 2018-10-22 MED ORDER — LISINOPRIL-HYDROCHLOROTHIAZIDE 20-12.5 MG PO TABS: 2.0000 | ORAL_TABLET | Freq: Every day | ORAL | 1 refills | Status: DC

## 2018-10-22 MED ORDER — METOPROLOL SUCCINATE ER 100 MG PO TB24: ORAL_TABLET | ORAL | 1 refills | Status: DC

## 2018-10-22 MED ORDER — DULOXETINE HCL 60 MG PO CPEP: ORAL_CAPSULE | ORAL | 1 refills | Status: DC

## 2018-10-22 MED ORDER — AMLODIPINE BESYLATE 5 MG PO TABS
5.0000 mg | ORAL_TABLET | Freq: Every day | ORAL | 1 refills | Status: DC
Start: 2018-10-22 — End: 2019-04-24

## 2018-10-22 MED ORDER — OMEPRAZOLE 20 MG PO CPDR: DELAYED_RELEASE_CAPSULE | ORAL | 1 refills | Status: DC

## 2018-10-22 NOTE — Patient Instructions (Signed)

## 2018-10-22 NOTE — Addendum Note (Signed)
Addended by: Chevis Pretty on: 10/22/2018 03:23 PM   Modules accepted: Orders

## 2018-10-22 NOTE — Progress Notes (Signed)
Subjective:    Patient ID: Amanda Kelly, female    DOB: 10-04-1960, 58 y.o.   MRN: [id number]   Chief Complaint: Medical Management of Chronic Issues    HPI:  1. Essential hypertension No c/o chest pain, sob or headache. Does not check blood pressure at home. BP Readings from Last 3 Encounters:  10/22/18 124/78  01/14/18 138/82  07/05/17 135/82     2. Mixed hyperlipidemia Does not watch diet and does very little exercise  3. GERD without esophagitis Is on omeprazole daily and works well to keep symptoms under control  4. ANXIETY DEPRESSION Is on cymbalta daily and helps keep her calm. Depression screen [facility] 2/9 10/22/2018 01/14/2018 07/05/2017  Decreased Interest 0 0 0  Down, Depressed, Hopeless 0 0 0  PHQ - 2 Score 0 0 0  Altered sleeping - - -  Tired, decreased energy - - -  Change in appetite - - -  Feeling bad or failure about yourself  - - -  Trouble concentrating - - -  Moving slowly or fidgety/restless - - -  Suicidal thoughts - - -  PHQ-9 Score - - -    5. BMI 40.0-44.9, adult ([place]) No recent weight changes    Outpatient Encounter Medications as of 10/22/2018  Medication Sig  . amLODipine (NORVASC) 5 MG tablet Take 1 tablet (5 mg total) by mouth daily.  Marland Kitchen atorvastatin (LIPITOR) 40 MG tablet Take 1 tablet (40 mg total) by mouth daily.  . DULoxetine (CYMBALTA) 60 MG capsule TAKE 1 CAPSULE BY MOUTH EVERY DAY  . lisinopril-hydrochlorothiazide (ZESTORETIC) 20-12.5 MG tablet Take 2 tablets by mouth daily.  . metoprolol succinate (TOPROL-XL) 100 MG 24 hr tablet TAKE 1 TABLET (100 MG TOTAL) BY MOUTH DAILY. TAKE WITH OR IMMEDIATELY FOLLOWING A MEAL.  Marland Kitchen omeprazole (PRILOSEC) 20 MG capsule TAKE 1 CAPSULE BY MOUTH EVERY DAY     Past Surgical History:  Procedure Laterality Date  . BREAST BIOPSY Right   . BREAST EXCISIONAL BIOPSY Left   . BREAST EXCISIONAL BIOPSY Left   . BREAST LUMPECTOMY WITH RADIOACTIVE SEED LOCALIZATION Left 07/07/2014   Procedure: LEFT  BREAST LUMPECTOMY WITH RADIOACTIVE SEED LOCALIZATION;  Surgeon: Erroll Luna, MD;  Location: [place];  Service: General;  Laterality: Left;  . BREAST MASS EXCISION Left 2009   left-papaloma  . COLONOSCOPY    . ENDOMETRIAL ABLATION  2010  . RE-EXCISION OF BREAST LUMPECTOMY Left 07/22/2014   Procedure:  LEFT BREAST REEXCISION LUMPECTOMY;  Surgeon: Erroll Luna, MD;  Location: [place];  Service: General;  Laterality: Left;    Family History  Problem Relation Age of Onset  . Breast cancer Maternal Grandmother 60  . Lung cancer Father        dx. 12s; smoker; no contact w/ father  . Breast cancer Father 20  . Breast cancer Sister        dx. 63-53; metastatic  . Memory loss Sister        short-term  . Breast cancer Maternal Aunt 60  . Cancer Paternal Uncle        kidney cancer   . Breast cancer Cousin 44       bilateral w/ metastasis  . Breast cancer Cousin        unknown age of dx  . Breast cancer Cousin 9       mastectomy  . Breast cancer Cousin 41       mastectomy  . BRCA 1/2 Cousin  testing in 2015 in [place]  . Kidney cancer Maternal Uncle        unknown age dx  . Colon cancer Maternal Uncle        unknown age dx.  . Stroke Maternal Uncle   . Breast cancer Other   . Cancer Paternal Uncle        colon cancer    New complaints: None today  Social history: Works part time.  Controlled substance contract: N/A    Review of Systems  Constitutional: Negative for activity change and appetite change.  HENT: Negative.   Eyes: Negative for pain.  Respiratory: Negative for shortness of breath.   Cardiovascular: Negative for chest pain, palpitations and leg swelling.  Gastrointestinal: Negative for abdominal pain.  Endocrine: Negative for polydipsia.  Genitourinary: Negative.   Skin: Negative for rash.  Neurological: Negative for dizziness, weakness and headaches.  Hematological: Does not bruise/bleed easily.   Psychiatric/Behavioral: Negative.   All other systems reviewed and are negative.      Objective:   Physical Exam Vitals signs and nursing note reviewed.  Constitutional:      General: She is not in acute distress.    Appearance: Normal appearance. She is well-developed.  HENT:     Head: Normocephalic.     Nose: Nose normal.  Eyes:     Pupils: Pupils are equal, round, and reactive to light.  Neck:     Musculoskeletal: Normal range of motion and neck supple.     Vascular: No carotid bruit or JVD.  Cardiovascular:     Rate and Rhythm: Normal rate and regular rhythm.     Heart sounds: Normal heart sounds.  Pulmonary:     Effort: Pulmonary effort is normal. No respiratory distress.     Breath sounds: Normal breath sounds. No wheezing or rales.  Chest:     Chest wall: No tenderness.  Abdominal:     General: Bowel sounds are normal. There is no distension or abdominal bruit.     Palpations: Abdomen is soft. There is no hepatomegaly, splenomegaly, mass or pulsatile mass.     Tenderness: There is no abdominal tenderness.  Musculoskeletal: Normal range of motion.  Lymphadenopathy:     Cervical: No cervical adenopathy.  Skin:    General: Skin is warm and dry.  Neurological:     Mental Status: She is alert and oriented to person, place, and time.     Deep Tendon Reflexes: Reflexes are normal and symmetric.  Psychiatric:        Behavior: Behavior normal.        Thought Content: Thought content normal.        Judgment: Judgment normal.     BP 124/78   Pulse 88   Temp (!) 96.8 F (36 C) (Oral)   Ht 5' 5"  (1.651 m)   Wt 244 lb (110.7 kg)   BMI 40.60 kg/m        Assessment & Plan:  ELMA SHANDS comes in today with chief complaint of Medical Management of Chronic Issues   Diagnosis and orders addressed:  1. Essential hypertension Low sodium - amLODipine (NORVASC) 5 MG tablet; Take 1 tablet (5 mg total) by mouth daily.  Dispense: 90 tablet; Refill: 1 -  lisinopril-hydrochlorothiazide (ZESTORETIC) 20-12.5 MG tablet; Take 2 tablets by mouth daily.  Dispense: 180 tablet; Refill: 1  2. Mixed hyperlipidemia Low fat diet - atorvastatin (LIPITOR) 40 MG tablet; Take 1 tablet (40 mg total) by mouth daily.  Dispense: 90  tablet; Refill: 1  3. GERD without esophagitis - omeprazole (PRILOSEC) 20 MG capsule; TAKE 1 CAPSULE BY MOUTH EVERY DAY  Dispense: 90 capsule; Refill: 1  4. ANXIETY DEPRESSION Stress management - DULoxetine (CYMBALTA) 60 MG capsule; TAKE 1 CAPSULE BY MOUTH EVERY DAY  Dispense: 90 capsule; Refill: 1  5. BMI 40.0-44.9, adult ([place]) Discussed diet and exercise for person with BMI >25 Will recheck weight in 3-6 months   7. Tachycardia Avoid caffeine - metoprolol succinate (TOPROL-XL) 100 MG 24 hr tablet; TAKE 1 TABLET (100 MG TOTAL) BY MOUTH DAILY. TAKE WITH OR IMMEDIATELY FOLLOWING A MEAL.  Dispense: 90 tablet; Refill: 1   Labs pending Health Maintenance reviewed Diet and exercise encouraged  Follow up plan: 6 months   Mary-Margaret Hassell Done, FNP

## 2018-10-23 LAB — CMP14+EGFR
ALT: 30 IU/L (ref 0–32)
AST: 32 IU/L (ref 0–40)
Albumin/Globulin Ratio: 1.9 (ref 1.2–2.2)
Albumin: 4.7 g/dL (ref 3.8–4.9)
Alkaline Phosphatase: 92 IU/L (ref 39–117)
BUN/Creatinine Ratio: 24 — ABNORMAL HIGH (ref 9–23)
BUN: 12 mg/dL (ref 6–24)
Bilirubin Total: 0.4 mg/dL (ref 0.0–1.2)
CO2: 22 mmol/L (ref 20–29)
Calcium: 10.2 mg/dL (ref 8.7–10.2)
Chloride: 90 mmol/L — ABNORMAL LOW (ref 96–106)
Creatinine, Ser: 0.51 mg/dL — ABNORMAL LOW (ref 0.57–1.00)
GFR calc Af Amer: 125 mL/min/{1.73_m2} (ref >59)
GFR calc non Af Amer: 109 mL/min/{1.73_m2} (ref >59)
Globulin, Total: 2.5 g/dL (ref 1.5–4.5)
Glucose: 89 mg/dL (ref 65–99)
Potassium: 4.3 mmol/L (ref 3.5–5.2)
Sodium: 131 mmol/L — ABNORMAL LOW (ref 134–144)
Total Protein: 7.2 g/dL (ref 6.0–8.5)

## 2018-10-23 LAB — LIPID PANEL
Chol/HDL Ratio: 2.1 ratio (ref 0.0–4.4)
Cholesterol, Total: 152 mg/dL (ref 100–199)
HDL: 73 mg/dL (ref >39)
LDL Calculated: 61 mg/dL (ref 0–99)
Triglycerides: 88 mg/dL (ref 0–149)
VLDL Cholesterol Cal: 18 mg/dL (ref 5–40)

## 2018-12-06 DIAGNOSIS — Z20828 Contact with and (suspected) exposure to other viral communicable diseases: Secondary | ICD-10-CM | POA: Diagnosis not present

## 2018-12-24 ENCOUNTER — Ambulatory Visit: Payer: BLUE CROSS/BLUE SHIELD

## 2018-12-30 ENCOUNTER — Other Ambulatory Visit: Payer: Self-pay

## 2018-12-31 ENCOUNTER — Ambulatory Visit (INDEPENDENT_AMBULATORY_CARE_PROVIDER_SITE_OTHER): Payer: BC Managed Care – PPO

## 2018-12-31 DIAGNOSIS — Z23 Encounter for immunization: Secondary | ICD-10-CM

## 2019-01-16 ENCOUNTER — Telehealth: Payer: Self-pay | Admitting: Nurse Practitioner

## 2019-01-16 NOTE — Telephone Encounter (Signed)
Patient states she needs to only speak with Amanda Kelly or MMM would not give any more information.

## 2019-01-19 DIAGNOSIS — Z20828 Contact with and (suspected) exposure to other viral communicable diseases: Secondary | ICD-10-CM | POA: Diagnosis not present

## 2019-01-19 DIAGNOSIS — R519 Headache, unspecified: Secondary | ICD-10-CM | POA: Diagnosis not present

## 2019-04-24 ENCOUNTER — Ambulatory Visit (INDEPENDENT_AMBULATORY_CARE_PROVIDER_SITE_OTHER): Payer: BC Managed Care – PPO | Admitting: Nurse Practitioner

## 2019-04-24 ENCOUNTER — Encounter: Payer: Self-pay | Admitting: Nurse Practitioner

## 2019-04-24 DIAGNOSIS — F341 Dysthymic disorder: Secondary | ICD-10-CM | POA: Diagnosis not present

## 2019-04-24 DIAGNOSIS — E782 Mixed hyperlipidemia: Secondary | ICD-10-CM

## 2019-04-24 DIAGNOSIS — K219 Gastro-esophageal reflux disease without esophagitis: Secondary | ICD-10-CM | POA: Diagnosis not present

## 2019-04-24 DIAGNOSIS — I10 Essential (primary) hypertension: Secondary | ICD-10-CM | POA: Diagnosis not present

## 2019-04-24 DIAGNOSIS — R000 Tachycardia, unspecified: Secondary | ICD-10-CM

## 2019-04-24 DIAGNOSIS — Z6841 Body Mass Index (BMI) 40.0 and over, adult: Secondary | ICD-10-CM

## 2019-04-24 MED ORDER — METOPROLOL SUCCINATE ER 100 MG PO TB24: ORAL_TABLET | ORAL | 1 refills | Status: DC

## 2019-04-24 MED ORDER — LISINOPRIL-HYDROCHLOROTHIAZIDE 20-12.5 MG PO TABS: 2.0000 | ORAL_TABLET | Freq: Every day | ORAL | 1 refills | Status: DC

## 2019-04-24 MED ORDER — OMEPRAZOLE 20 MG PO CPDR: DELAYED_RELEASE_CAPSULE | ORAL | 1 refills | Status: DC

## 2019-04-24 MED ORDER — ATORVASTATIN CALCIUM 40 MG PO TABS: 40.0000 mg | ORAL_TABLET | Freq: Every day | ORAL | 1 refills | Status: DC

## 2019-04-24 MED ORDER — AMLODIPINE BESYLATE 5 MG PO TABS: 5.0000 mg | ORAL_TABLET | Freq: Every day | ORAL | 1 refills | Status: DC

## 2019-04-24 MED ORDER — DULOXETINE HCL 60 MG PO CPEP: ORAL_CAPSULE | ORAL | 1 refills | Status: DC

## 2019-04-24 NOTE — Progress Notes (Signed)
Virtual Visit via telephone Note Due to COVID-19 pandemic this visit was conducted virtually. This visit type was conducted due to national recommendations for restrictions regarding the COVID-19 Pandemic (e.g. social distancing, sheltering in place) in an effort to limit this patient's exposure and mitigate transmission in our community. All issues noted in this document were discussed and addressed.  A physical exam was not performed with this format.  I connected with Amanda Kelly on 04/24/19 at 2:30 by telephone and verified that I am speaking with the correct person using two identifiers. Amanda Kelly is currently located at home and no one is currently with her during visit. The provider, Mary-Margaret Hassell Done, FNP is located in their office at time of visit.  I discussed the limitations, risks, security and privacy concerns of performing an evaluation and management service by telephone and the availability of in person appointments. I also discussed with the patient that there may be a patient responsible charge related to this service. The patient expressed understanding and agreed to proceed.   History and Present Illness:   Chief Complaint: medical management of chronic issues   HPI:  1. Essential hypertension No c/o chest pain, sob or headache. Does check blood pressure occasionaly. BP Readings from Last 3 Encounters:  10/22/18 124/78  01/14/18 138/82  07/05/17 135/82     2. Mixed hyperlipidemia Does watch diet some. Does not much exercise. Lab Results  Component Value Date   CHOL 152 10/22/2018   HDL 73 10/22/2018   LDLCALC 61 10/22/2018   TRIG 88 10/22/2018   CHOLHDL 2.1 10/22/2018     3. GERD without esophagitis Is on omeprazole daily and is doing well.  4. ANXIETY DEPRESSION Is on cymbalta and that seems to be working for her. She still has occasional bad days. Depression screen [facility] 2/9 10/22/2018 01/14/2018 07/05/2017  Decreased Interest 0 0 0  Down,  Depressed, Hopeless 0 0 0  PHQ - 2 Score 0 0 0  Altered sleeping - - -  Tired, decreased energy - - -  Change in appetite - - -  Feeling bad or failure about yourself  - - -  Trouble concentrating - - -  Moving slowly or fidgety/restless - - -  Suicidal thoughts - - -  PHQ-9 Score - - -   GAD 7 : Generalized Anxiety Score 04/24/2019  Nervous, Anxious, on Edge 1  Control/stop worrying 0  Worry too much - different things 0  Trouble relaxing 0  Restless 0  Easily annoyed or irritable 0  Afraid - awful might happen 0  Total GAD 7 Score 1  Anxiety Difficulty Somewhat difficult      5. BMI 40.0-44.9, adult ([place]) No recent weight changes Wt Readings from Last 3 Encounters:  10/22/18 244 lb (110.7 kg)  01/14/18 241 lb (109.3 kg)  07/05/17 242 lb (109.8 kg)   BMI Readings from Last 3 Encounters:  10/22/18 40.60 kg/m  01/14/18 40.10 kg/m  07/05/17 40.27 kg/m       Outpatient Encounter Medications as of 04/24/2019  Medication Sig  . amLODipine (NORVASC) 5 MG tablet Take 1 tablet (5 mg total) by mouth daily.  Marland Kitchen atorvastatin (LIPITOR) 40 MG tablet Take 1 tablet (40 mg total) by mouth daily.  . DULoxetine (CYMBALTA) 60 MG capsule TAKE 1 CAPSULE BY MOUTH EVERY DAY  . lisinopril-hydrochlorothiazide (ZESTORETIC) 20-12.5 MG tablet Take 2 tablets by mouth daily.  . metoprolol succinate (TOPROL-XL) 100 MG 24 hr tablet TAKE 1 TABLET (  100 MG TOTAL) BY MOUTH DAILY. TAKE WITH OR IMMEDIATELY FOLLOWING A MEAL.  Marland Kitchen omeprazole (PRILOSEC) 20 MG capsule TAKE 1 CAPSULE BY MOUTH EVERY DAY     Past Surgical History:  Procedure Laterality Date  . BREAST BIOPSY Right   . BREAST EXCISIONAL BIOPSY Left   . BREAST EXCISIONAL BIOPSY Left   . BREAST LUMPECTOMY WITH RADIOACTIVE SEED LOCALIZATION Left 07/07/2014   Procedure: LEFT BREAST LUMPECTOMY WITH RADIOACTIVE SEED LOCALIZATION;  Surgeon: Erroll Luna, MD;  Location: [facility];  Service: General;  Laterality: Left;  . BREAST  MASS EXCISION Left 2009   left-papaloma  . COLONOSCOPY    . ENDOMETRIAL ABLATION  2010  . RE-EXCISION OF BREAST LUMPECTOMY Left 07/22/2014   Procedure:  LEFT BREAST REEXCISION LUMPECTOMY;  Surgeon: Erroll Luna, MD;  Location: [place];  Service: General;  Laterality: Left;    Family History  Problem Relation Age of Onset  . Breast cancer Maternal Grandmother 60  . Lung cancer Father        dx. 44s; smoker; no contact w/ father  . Breast cancer Father 59  . Breast cancer Sister        dx. 61-53; metastatic  . Memory loss Sister        short-term  . Breast cancer Maternal Aunt 60  . Cancer Paternal Uncle        kidney cancer   . Breast cancer Cousin 57       bilateral w/ metastasis  . Breast cancer Cousin        unknown age of dx  . Breast cancer Cousin 7       mastectomy  . Breast cancer Cousin 41       mastectomy  . BRCA 1/2 Cousin        testing in 2015 in [place]  . Kidney cancer Maternal Uncle        unknown age dx  . Colon cancer Maternal Uncle        unknown age dx.  . Stroke Maternal Uncle   . Breast cancer Other   . Cancer Paternal Uncle        colon cancer    New complaints: None today  Social history: Lives with her husband and 3 boys  Controlled substance contract: n/a     Review of Systems  Constitutional: Negative for diaphoresis and weight loss.  Eyes: Negative for blurred vision, double vision and pain.  Respiratory: Negative for shortness of breath.   Cardiovascular: Negative for chest pain, palpitations, orthopnea and leg swelling.  Gastrointestinal: Negative for abdominal pain.  Skin: Negative for rash.  Neurological: Negative for dizziness, sensory change, loss of consciousness, weakness and headaches.  Endo/Heme/Allergies: Negative for polydipsia. Does not bruise/bleed easily.  Psychiatric/Behavioral: Negative for memory loss. The patient does not have insomnia.   All other systems reviewed and are  negative.    Observations/Objective: Alert and oriented- answers all questions appropriately No distress    Assessment and Plan: Amanda Kelly comes in today with chief complaint of No chief complaint on file.   Diagnosis and orders addressed:  1. Essential hypertension Low sodium diet - amLODipine (NORVASC) 5 MG tablet; Take 1 tablet (5 mg total) by mouth daily.  Dispense: 90 tablet; Refill: 1 - lisinopril-hydrochlorothiazide (ZESTORETIC) 20-12.5 MG tablet; Take 2 tablets by mouth daily.  Dispense: 180 tablet; Refill: 1  2. Mixed hyperlipidemia Low fat diet - atorvastatin (LIPITOR) 40 MG tablet; Take 1 tablet (40 mg  total) by mouth daily.  Dispense: 90 tablet; Refill: 1  3. GERD without esophagitis Avoid spicy foods Do not eat 2 hours prior to bedtime - omeprazole (PRILOSEC) 20 MG capsule; TAKE 1 CAPSULE BY MOUTH EVERY DAY  Dispense: 90 capsule; Refill: 1  4. ANXIETY DEPRESSION Stress management - DULoxetine (CYMBALTA) 60 MG capsule; TAKE 1 CAPSULE BY MOUTH EVERY DAY  Dispense: 90 capsule; Refill: 1  5. BMI 40.0-44.9, adult ([place]) Discussed diet and exercise for person with BMI >25 Will recheck weight in 3-6 month  6. Tachycardia Avoid caffeine - metoprolol succinate (TOPROL-XL) 100 MG 24 hr tablet; TAKE 1 TABLET (100 MG TOTAL) BY MOUTH DAILY. TAKE WITH OR IMMEDIATELY FOLLOWING A MEAL.  Dispense: 90 tablet; Refill: 1   Labs pending Health Maintenance reviewed Diet and exercise encouraged  Follow up plan: 6 months      I discussed the assessment and treatment plan with the patient. The patient was provided an opportunity to ask questions and all were answered. The patient agreed with the plan and demonstrated an understanding of the instructions.   The patient was advised to call back or seek an in-person evaluation if the symptoms worsen or if the condition fails to improve as anticipated.  The above assessment and management plan was discussed with the  patient. The patient verbalized understanding of and has agreed to the management plan. Patient is aware to call the clinic if symptoms persist or worsen. Patient is aware when to return to the clinic for a follow-up visit. Patient educated on when it is appropriate to go to the emergency department.   Time call ended:  2:47 I provided 17 minutes of non-face-to-face time during this encounter.    Mary-Margaret Hassell Done, FNP

## 2019-08-06 ENCOUNTER — Other Ambulatory Visit: Payer: Self-pay | Admitting: Nurse Practitioner

## 2019-08-06 DIAGNOSIS — Z1231 Encounter for screening mammogram for malignant neoplasm of breast: Secondary | ICD-10-CM

## 2019-09-25 ENCOUNTER — Ambulatory Visit: Payer: BC Managed Care – PPO

## 2019-10-17 ENCOUNTER — Other Ambulatory Visit: Payer: Self-pay | Admitting: Nurse Practitioner

## 2019-10-17 DIAGNOSIS — K219 Gastro-esophageal reflux disease without esophagitis: Secondary | ICD-10-CM

## 2019-10-17 DIAGNOSIS — F341 Dysthymic disorder: Secondary | ICD-10-CM

## 2019-10-17 DIAGNOSIS — E782 Mixed hyperlipidemia: Secondary | ICD-10-CM

## 2019-10-17 DIAGNOSIS — I10 Essential (primary) hypertension: Secondary | ICD-10-CM

## 2019-10-17 DIAGNOSIS — R000 Tachycardia, unspecified: Secondary | ICD-10-CM

## 2019-11-05 ENCOUNTER — Ambulatory Visit: Payer: Self-pay | Admitting: Nurse Practitioner

## 2019-11-19 ENCOUNTER — Encounter: Payer: Self-pay | Admitting: Nurse Practitioner

## 2020-01-10 ENCOUNTER — Other Ambulatory Visit: Payer: Self-pay | Admitting: Nurse Practitioner

## 2020-01-10 DIAGNOSIS — R000 Tachycardia, unspecified: Secondary | ICD-10-CM

## 2020-01-11 ENCOUNTER — Other Ambulatory Visit: Payer: Self-pay | Admitting: Nurse Practitioner

## 2020-01-11 DIAGNOSIS — E782 Mixed hyperlipidemia: Secondary | ICD-10-CM

## 2020-01-11 DIAGNOSIS — I10 Essential (primary) hypertension: Secondary | ICD-10-CM

## 2020-01-11 DIAGNOSIS — F341 Dysthymic disorder: Secondary | ICD-10-CM

## 2020-01-11 DIAGNOSIS — K219 Gastro-esophageal reflux disease without esophagitis: Secondary | ICD-10-CM

## 2020-01-12 NOTE — Telephone Encounter (Signed)
Patient due for f/u - 30 days sent in

## 2020-02-05 ENCOUNTER — Other Ambulatory Visit: Payer: Self-pay | Admitting: Nurse Practitioner

## 2020-02-05 DIAGNOSIS — R000 Tachycardia, unspecified: Secondary | ICD-10-CM

## 2020-02-05 DIAGNOSIS — F341 Dysthymic disorder: Secondary | ICD-10-CM

## 2020-02-05 DIAGNOSIS — K219 Gastro-esophageal reflux disease without esophagitis: Secondary | ICD-10-CM

## 2020-02-05 DIAGNOSIS — E782 Mixed hyperlipidemia: Secondary | ICD-10-CM

## 2020-02-05 DIAGNOSIS — I10 Essential (primary) hypertension: Secondary | ICD-10-CM

## 2020-02-05 NOTE — Telephone Encounter (Signed)
MMM NTBS 30 days given 01/12/20

## 2020-02-06 ENCOUNTER — Other Ambulatory Visit: Payer: Self-pay | Admitting: Nurse Practitioner

## 2020-02-06 DIAGNOSIS — F341 Dysthymic disorder: Secondary | ICD-10-CM

## 2020-03-11 ENCOUNTER — Ambulatory Visit: Payer: BC Managed Care – PPO | Admitting: Nurse Practitioner

## 2020-03-11 ENCOUNTER — Other Ambulatory Visit: Payer: Self-pay

## 2020-03-11 ENCOUNTER — Encounter: Payer: Self-pay | Admitting: Nurse Practitioner

## 2020-03-11 VITALS — BP 118/82 | HR 90 | Temp 97.5°F | Resp 20 | Ht 65.0 in | Wt 246.0 lb

## 2020-03-11 DIAGNOSIS — K219 Gastro-esophageal reflux disease without esophagitis: Secondary | ICD-10-CM

## 2020-03-11 DIAGNOSIS — F341 Dysthymic disorder: Secondary | ICD-10-CM

## 2020-03-11 DIAGNOSIS — I10 Essential (primary) hypertension: Secondary | ICD-10-CM | POA: Diagnosis not present

## 2020-03-11 DIAGNOSIS — R609 Edema, unspecified: Secondary | ICD-10-CM

## 2020-03-11 DIAGNOSIS — R000 Tachycardia, unspecified: Secondary | ICD-10-CM

## 2020-03-11 DIAGNOSIS — Z6841 Body Mass Index (BMI) 40.0 and over, adult: Secondary | ICD-10-CM

## 2020-03-11 DIAGNOSIS — E782 Mixed hyperlipidemia: Secondary | ICD-10-CM

## 2020-03-11 MED ORDER — AMLODIPINE BESYLATE 5 MG PO TABS: 5.0000 mg | ORAL_TABLET | Freq: Every day | ORAL | 1 refills | Status: DC

## 2020-03-11 MED ORDER — ATORVASTATIN CALCIUM 40 MG PO TABS: 40.0000 mg | ORAL_TABLET | Freq: Every day | ORAL | 1 refills | Status: DC

## 2020-03-11 MED ORDER — DULOXETINE HCL 60 MG PO CPEP: ORAL_CAPSULE | ORAL | 1 refills | Status: DC

## 2020-03-11 MED ORDER — OMEPRAZOLE 20 MG PO CPDR: DELAYED_RELEASE_CAPSULE | ORAL | 1 refills | Status: DC

## 2020-03-11 MED ORDER — LISINOPRIL 20 MG PO TABS
20.0000 mg | ORAL_TABLET | Freq: Every day | ORAL | 1 refills | Status: DC
Start: 2020-03-11 — End: 2020-09-03

## 2020-03-11 MED ORDER — LISINOPRIL-HYDROCHLOROTHIAZIDE 20-12.5 MG PO TABS: 2.0000 | ORAL_TABLET | Freq: Every day | ORAL | 1 refills | Status: DC

## 2020-03-11 MED ORDER — METOPROLOL SUCCINATE ER 100 MG PO TB24: ORAL_TABLET | ORAL | 1 refills | Status: DC

## 2020-03-11 MED ORDER — FUROSEMIDE 20 MG PO TABS: 20.0000 mg | ORAL_TABLET | Freq: Every day | ORAL | 3 refills | Status: DC

## 2020-03-11 NOTE — Patient Instructions (Signed)
Peripheral Edema  Peripheral edema is swelling that is caused by a buildup of fluid. Peripheral edema most often affects the lower legs, ankles, and feet. It can also develop in the arms, hands, and face. The area of the body that has peripheral edema will look swollen. It may also feel heavy or warm. Your clothes may start to feel tight. Pressing on the area may make a temporary dent in your skin. You may not be able to move your swollen arm or leg as much as usual. There are many causes of peripheral edema. It can happen because of a complication of other conditions such as congestive heart failure, kidney disease, or a problem with your blood circulation. It also can be a side effect of certain medicines or because of an infection. It often happens to women during pregnancy. Sometimes, the cause is not known. Follow these instructions at home: Managing pain, stiffness, and swelling   Raise (elevate) your legs while you are sitting or lying down.  Move around often to prevent stiffness and to lessen swelling.  Do not sit or stand for long periods of time.  Wear support stockings as told by your health care provider. Medicines  Take over-the-counter and prescription medicines only as told by your health care provider.  Your health care provider may prescribe medicine to help your body get rid of excess water (diuretic). General instructions  Pay attention to any changes in your symptoms.  Follow instructions from your health care provider about limiting salt (sodium) in your diet. Sometimes, eating less salt may reduce swelling.  Moisturize skin daily to help prevent skin from cracking and draining.  Keep all follow-up visits as told by your health care provider. This is important. Contact a health care provider if you have:  A fever.  Edema that starts suddenly or is getting worse, especially if you are pregnant or have a medical condition.  Swelling in only one leg.  Increased  swelling, redness, or pain in one or both of your legs.  Drainage or sores at the area where you have edema. Get help right away if you:  Develop shortness of breath, especially when you are lying down.  Have pain in your chest or abdomen.  Feel weak.  Feel faint. Summary  Peripheral edema is swelling that is caused by a buildup of fluid. Peripheral edema most often affects the lower legs, ankles, and feet.  Move around often to prevent stiffness and to lessen swelling. Do not sit or stand for long periods of time.  Pay attention to any changes in your symptoms.  Contact a health care provider if you have edema that starts suddenly or is getting worse, especially if you are pregnant or have a medical condition.  Get help right away if you develop shortness of breath, especially when lying down. This information is not intended to replace advice given to you by your health care provider. Make sure you discuss any questions you have with your health care provider. Document Revised: 12/05/2017 Document Reviewed: 12/05/2017 Elsevier Patient Education  2020 [employer]  

## 2020-03-11 NOTE — Progress Notes (Signed)
Subjective:    Patient ID: Amanda Kelly, female    DOB: Dec 31, 1960, 59 y.o.   MRN: [id number]   Chief Complaint: medical management of chronic issues     HPI:  1. Primary hypertension No c/o chest pain, sob or headache. Does not check blood pressure at home. BP Readings from Last 3 Encounters:  10/22/18 124/78  01/14/18 138/82  07/05/17 135/82     2. Mixed hyperlipidemia Does not watch diet and does very little exercise. Lab Results  Component Value Date   CHOL 152 10/22/2018   HDL 73 10/22/2018   LDLCALC 61 10/22/2018   TRIG 88 10/22/2018   CHOLHDL 2.1 10/22/2018     3. GERD without esophagitis Takes omeprazole daily and is doing well.  4. ANXIETY DEPRESSION She is  On cymbalta which works well for her depression and anxiety GAD 7 : Generalized Anxiety Score 03/11/2020 04/24/2019  Nervous, Anxious, on Edge 0 1  Control/stop worrying 0 0  Worry too much - different things 0 0  Trouble relaxing 0 0  Restless 0 0  Easily annoyed or irritable 0 0  Afraid - awful might happen 0 0  Total GAD 7 Score 0 1  Anxiety Difficulty Not difficult at all Somewhat difficult    Depression screen [facility] 2/9 03/11/2020 10/22/2018 01/14/2018  Decreased Interest 0 0 0  Down, Depressed, Hopeless 0 0 0  PHQ - 2 Score 0 0 0  Altered sleeping 0 - -  Tired, decreased energy 0 - -  Change in appetite 0 - -  Feeling bad or failure about yourself  0 - -  Trouble concentrating 0 - -  Moving slowly or fidgety/restless 0 - -  Suicidal thoughts 0 - -  PHQ-9 Score 0 - -  Difficult doing work/chores Not difficult at all - -     5. BMI 40.0-44.9, adult ([place]) No recent weight changes Wt Readings from Last 3 Encounters:  03/11/20 246 lb (111.6 kg)  10/22/18 244 lb (110.7 kg)  01/14/18 241 lb (109.3 kg)   BMI Readings from Last 3 Encounters:  03/11/20 40.94 kg/m  10/22/18 40.60 kg/m  01/14/18 40.10 kg/m       Outpatient Encounter Medications as of 03/11/2020  Medication Sig   . amLODipine (NORVASC) 5 MG tablet Take 1 tablet (5 mg total) by mouth daily. (Needs to be seen before next refill)  . atorvastatin (LIPITOR) 40 MG tablet Take 1 tablet (40 mg total) by mouth daily. (Needs to be seen before next refill)  . DULoxetine (CYMBALTA) 60 MG capsule TAKE 1 CAPSULE BY MOUTH EVERY DAY (Needs to be seen before next refill)  . lisinopril-hydrochlorothiazide (ZESTORETIC) 20-12.5 MG tablet Take 2 tablets by mouth daily. (Needs to be seen before next refill)  . metoprolol succinate (TOPROL-XL) 100 MG 24 hr tablet TAKE 1 TABLET (100 MG TOTAL) BY MOUTH DAILY. TAKE WITH OR IMMEDIATELY FOLLOWING A MEAL. Follow up appointment needed for further refills  . omeprazole (PRILOSEC) 20 MG capsule TAKE 1 CAPSULE BY MOUTH EVERY DAY (Needs to be seen before next refill)    Past Surgical History:  Procedure Laterality Date  . BREAST BIOPSY Right   . BREAST EXCISIONAL BIOPSY Left   . BREAST EXCISIONAL BIOPSY Left   . BREAST LUMPECTOMY WITH RADIOACTIVE SEED LOCALIZATION Left 07/07/2014   Procedure: LEFT BREAST LUMPECTOMY WITH RADIOACTIVE SEED LOCALIZATION;  Surgeon: Erroll Luna, MD;  Location: [facility];  Service: General;  Laterality: Left;  . 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 COLONOSCOPY    . ENDOMETRIAL ABLATION  2010  . RE-EXCISION OF BREAST LUMPECTOMY Left 07/22/2014   Procedure:  LEFT BREAST REEXCISION LUMPECTOMY;  Surgeon: Erroll Luna, MD;  Location: [facility];  Service: General;  Laterality: Left;    Family History  Problem Relation Age of Onset  . Breast cancer Maternal Grandmother 60  . Lung cancer Father        dx. 5s; smoker; no contact w/ father  . Breast cancer Father 27  . Breast cancer Sister        dx. 63-53; metastatic  . Memory loss Sister        short-term  . Breast cancer Maternal Aunt 60  . Cancer Paternal Uncle        kidney cancer   . Breast cancer Cousin 24       bilateral w/ metastasis  . Breast  cancer Cousin        unknown age of dx  . Breast cancer Cousin 6       mastectomy  . Breast cancer Cousin 41       mastectomy  . BRCA 1/2 Cousin        testing in 2015 in [place]  . Kidney cancer Maternal Uncle        unknown age dx  . Colon cancer Maternal Uncle        unknown age dx.  . Stroke Maternal Uncle   . Breast cancer Other   . Cancer Paternal Uncle        colon cancer    New complaints: None today  Social history: Lives with her husband and 3 boys  Controlled substance contract: n/a    Review of Systems  Constitutional: Negative for diaphoresis.  Eyes: Negative for pain.  Respiratory: Negative for shortness of breath.   Cardiovascular: Negative for chest pain, palpitations and leg swelling.  Gastrointestinal: Negative for abdominal pain.  Endocrine: Negative for polydipsia.  Skin: Negative for rash.  Neurological: Negative for dizziness, weakness and headaches.  Hematological: Does not bruise/bleed easily.  All other systems reviewed and are negative.      Objective:   Physical Exam Vitals and nursing note reviewed.  Constitutional:      General: She is not in acute distress.    Appearance: Normal appearance. She is well-developed and well-nourished.  HENT:     Head: Normocephalic.     Nose: Nose normal.     Mouth/Throat:     Mouth: Oropharynx is clear and moist.  Eyes:     Extraocular Movements: EOM normal.     Pupils: Pupils are equal, round, and reactive to light.  Neck:     Vascular: No carotid bruit or JVD.  Cardiovascular:     Rate and Rhythm: Normal rate and regular rhythm.     Pulses: Intact distal pulses.     Heart sounds: Normal heart sounds.  Pulmonary:     Effort: Pulmonary effort is normal. No respiratory distress.     Breath sounds: Normal breath sounds. No wheezing or rales.  Chest:     Chest wall: No tenderness.  Abdominal:     General: Bowel sounds are normal. There is no distension or abdominal bruit. Aorta is normal.      Palpations: Abdomen is soft. There is no hepatomegaly, splenomegaly, mass or pulsatile mass.     Tenderness: There is no abdominal tenderness.  Musculoskeletal:  General: Normal range of motion.     Cervical back: Normal range of motion and neck supple.     Right lower leg: Edema (1+) present.     Left lower leg: Edema (1+) present.  Lymphadenopathy:     Cervical: No cervical adenopathy.  Skin:    General: Skin is warm and dry.  Neurological:     Mental Status: She is alert and oriented to person, place, and time.     Deep Tendon Reflexes: Reflexes are normal and symmetric.  Psychiatric:        Mood and Affect: Mood and affect normal.        Behavior: Behavior normal.        Thought Content: Thought content normal.        Judgment: Judgment normal.     BP 118/82   Pulse 90   Temp (!) 97.5 F (36.4 C) (Temporal)   Resp 20   Ht 5' 5"  (1.651 m)   Wt 246 lb (111.6 kg)   SpO2 98%   BMI 40.94 kg/m         Assessment & Plan:  Amanda Kelly comes in today with chief complaint of Medical Management of Chronic Issues   Diagnosis and orders addressed:  1. Primary hypertension Low sodium diet Changed lisinopril HCTZ to plain lisinopril - amLODipine (NORVASC) 5 MG tablet; Take 1 tablet (5 mg total) by mouth daily. (Needs to be seen before next refill)  Dispense: 90 tablet; Refill: 1 - lisinopril (ZESTRIL) 20 MG tablet; Take 1 tablet (20 mg total) by mouth daily.  Dispense: 90 tablet; Refill: 1  2. Mixed hyperlipidemia Low fat diet - atorvastatin (LIPITOR) 40 MG tablet; Take 1 tablet (40 mg total) by mouth daily. (Needs to be seen before next refill)  Dispense: 90 tablet; Refill: 1  3. GERD without esophagitis Avoid spicy foods Do not eat 2 hours prior to bedtime - omeprazole (PRILOSEC) 20 MG capsule; TAKE 1 CAPSULE BY MOUTH EVERY DAY (Needs to be seen before next refill)  Dispense: 90 capsule; Refill: 1   4. ANXIETY DEPRESSION Stress management - DULoxetine  (CYMBALTA) 60 MG capsule; TAKE 1 CAPSULE BY MOUTH EVERY DAY (Needs to be seen before next refill)  Dispense: 90 capsule; Refill: 1  5. BMI 40.0-44.9, adult ([place]) Discussed diet and exercise for person with BMI >25 Will recheck weight in 3-6 months   6. Tachycardia Avoid caffeine - metoprolol succinate (TOPROL-XL) 100 MG 24 hr tablet; TAKE 1 TABLET (100 MG TOTAL) BY MOUTH DAILY. TAKE WITH OR IMMEDIATELY FOLLOWING A MEAL. Follow up appointment needed for further refills  Dispense: 90 tablet; Refill: 1  7. Peripheral edema Added lasix to meds - furosemide (LASIX) 20 MG tablet; Take 1 tablet (20 mg total) by mouth daily.  Dispense: 30 tablet; Refill: 3   Labs pending Health Maintenance reviewed Diet and exercise encouraged  Follow up plan: 6 months   Amanda Hassell Done, FNP

## 2020-06-06 ENCOUNTER — Other Ambulatory Visit: Payer: Self-pay | Admitting: Nurse Practitioner

## 2020-06-06 DIAGNOSIS — R609 Edema, unspecified: Secondary | ICD-10-CM

## 2020-09-03 ENCOUNTER — Other Ambulatory Visit: Payer: Self-pay | Admitting: Nurse Practitioner

## 2020-09-03 DIAGNOSIS — I10 Essential (primary) hypertension: Secondary | ICD-10-CM

## 2020-09-03 MED ORDER — LISINOPRIL 20 MG PO TABS
40.0000 mg | ORAL_TABLET | Freq: Every day | ORAL | 1 refills | Status: DC
Start: 2020-09-03 — End: 2020-09-13

## 2020-09-06 ENCOUNTER — Other Ambulatory Visit: Payer: Self-pay | Admitting: Nurse Practitioner

## 2020-09-06 DIAGNOSIS — E782 Mixed hyperlipidemia: Secondary | ICD-10-CM

## 2020-09-06 DIAGNOSIS — R000 Tachycardia, unspecified: Secondary | ICD-10-CM

## 2020-09-06 DIAGNOSIS — K219 Gastro-esophageal reflux disease without esophagitis: Secondary | ICD-10-CM

## 2020-09-06 DIAGNOSIS — I10 Essential (primary) hypertension: Secondary | ICD-10-CM

## 2020-09-07 ENCOUNTER — Other Ambulatory Visit: Payer: Self-pay | Admitting: Nurse Practitioner

## 2020-09-07 DIAGNOSIS — R609 Edema, unspecified: Secondary | ICD-10-CM

## 2020-09-09 ENCOUNTER — Other Ambulatory Visit: Payer: Self-pay | Admitting: Nurse Practitioner

## 2020-09-09 DIAGNOSIS — F341 Dysthymic disorder: Secondary | ICD-10-CM

## 2020-09-13 ENCOUNTER — Other Ambulatory Visit: Payer: Self-pay

## 2020-09-13 ENCOUNTER — Encounter: Payer: Self-pay | Admitting: Nurse Practitioner

## 2020-09-13 ENCOUNTER — Other Ambulatory Visit: Payer: Self-pay | Admitting: Nurse Practitioner

## 2020-09-13 ENCOUNTER — Ambulatory Visit: Payer: BC Managed Care – PPO | Admitting: Nurse Practitioner

## 2020-09-13 VITALS — BP 128/82 | HR 91 | Temp 98.2°F | Resp 20 | Ht 65.0 in | Wt 241.0 lb

## 2020-09-13 DIAGNOSIS — R600 Localized edema: Secondary | ICD-10-CM

## 2020-09-13 DIAGNOSIS — Z6841 Body Mass Index (BMI) 40.0 and over, adult: Secondary | ICD-10-CM

## 2020-09-13 DIAGNOSIS — E782 Mixed hyperlipidemia: Secondary | ICD-10-CM | POA: Diagnosis not present

## 2020-09-13 DIAGNOSIS — F341 Dysthymic disorder: Secondary | ICD-10-CM | POA: Diagnosis not present

## 2020-09-13 DIAGNOSIS — R000 Tachycardia, unspecified: Secondary | ICD-10-CM

## 2020-09-13 DIAGNOSIS — K219 Gastro-esophageal reflux disease without esophagitis: Secondary | ICD-10-CM

## 2020-09-13 DIAGNOSIS — I10 Essential (primary) hypertension: Secondary | ICD-10-CM

## 2020-09-13 DIAGNOSIS — R609 Edema, unspecified: Secondary | ICD-10-CM

## 2020-09-13 MED ORDER — LISINOPRIL 20 MG PO TABS: 40.0000 mg | ORAL_TABLET | Freq: Every day | ORAL | 1 refills | Status: DC

## 2020-09-13 MED ORDER — METOPROLOL SUCCINATE ER 100 MG PO TB24: ORAL_TABLET | ORAL | 1 refills | Status: DC

## 2020-09-13 MED ORDER — FUROSEMIDE 20 MG PO TABS: 20.0000 mg | ORAL_TABLET | Freq: Every day | ORAL | 1 refills | Status: DC

## 2020-09-13 MED ORDER — ATORVASTATIN CALCIUM 40 MG PO TABS: 40.0000 mg | ORAL_TABLET | Freq: Every day | ORAL | 1 refills | Status: DC

## 2020-09-13 MED ORDER — AMLODIPINE BESYLATE 5 MG PO TABS
5.0000 mg | ORAL_TABLET | Freq: Every day | ORAL | 1 refills | Status: DC
Start: 2020-09-13 — End: 2021-03-07

## 2020-09-13 NOTE — Patient Instructions (Signed)

## 2020-09-13 NOTE — Progress Notes (Signed)
Subjective:    Patient ID: Amanda Kelly, female    DOB: 1960-05-09, 60 y.o.   MRN: [id number]   Chief Complaint: medical management of chronic issues     HPI:  1. Primary hypertension No c/o chest pain, sob or headache. Does not check blood pressure at home. BP Readings from Last 3 Encounters:  09/13/20 128/82  03/11/20 118/82  10/22/18 124/78       2. Mixed hyperlipidemia Does not watch diet very closely and doe sno dedicated exercise. Lab Results  Component Value Date   CHOL 152 10/22/2018   HDL 73 10/22/2018   LDLCALC 61 10/22/2018   TRIG 88 10/22/2018   CHOLHDL 2.1 10/22/2018     3. GERD without esophagitis Is on omeprazole daily and is doing well.  4. ANXIETY DEPRESSION Is on cymbalta and is doing well. GAD 7 : Generalized Anxiety Score 09/13/2020 03/11/2020 04/24/2019  Nervous, Anxious, on Edge 0 0 1  Control/stop worrying 0 0 0  Worry too much - different things 0 0 0  Trouble relaxing 0 0 0  Restless 0 0 0  Easily annoyed or irritable 0 0 0  Afraid - awful might happen 0 0 0  Total GAD 7 Score 0 0 1  Anxiety Difficulty Not difficult at all Not difficult at all Somewhat difficult     5. BMI 40.0-44.9, adult ([facility]) Weight is down 5lbs Wt Readings from Last 3 Encounters:  09/13/20 241 lb (109.3 kg)  03/11/20 246 lb (111.6 kg)  10/22/18 244 lb (110.7 kg)   BMI Readings from Last 3 Encounters:  09/13/20 40.10 kg/m  03/11/20 40.94 kg/m  10/22/18 40.60 kg/m       Outpatient Encounter Medications as of 09/13/2020  Medication Sig   amLODipine (NORVASC) 5 MG tablet Take 1 tablet (5 mg total) by mouth daily.   atorvastatin (LIPITOR) 40 MG tablet Take 1 tablet (40 mg total) by mouth daily.   DULoxetine (CYMBALTA) 60 MG capsule TAKE 1 CAPSULE BY MOUTH EVERY DAY (NEEDS TO BE SEEN BEFORE NEXT REFILL)   furosemide (LASIX) 20 MG tablet TAKE 1 TABLET BY MOUTH EVERY DAY   lisinopril (ZESTRIL) 20 MG tablet Take 2 tablets (40 mg total) by mouth daily.    metoprolol succinate (TOPROL-XL) 100 MG 24 hr tablet TAKE 1 TABLET BY MOUTH ONCE DAILY WITH OR IMMEDIATELY FOLLOWING A MEAL   omeprazole (PRILOSEC) 20 MG capsule TAKE 1 CAPSULE BY MOUTH EVERY DAY   No facility-administered encounter medications on file as of 09/13/2020.    Past Surgical History:  Procedure Laterality Date   BREAST BIOPSY Right    BREAST EXCISIONAL BIOPSY Left    BREAST EXCISIONAL BIOPSY Left    BREAST LUMPECTOMY WITH RADIOACTIVE SEED LOCALIZATION Left 07/07/2014   Procedure: LEFT BREAST LUMPECTOMY WITH RADIOACTIVE SEED LOCALIZATION;  Surgeon: Erroll Luna, MD;  Location: [facility];  Service: General;  Laterality: Left;   BREAST MASS EXCISION Left 2009   left-papaloma   COLONOSCOPY     ENDOMETRIAL ABLATION  2010   RE-EXCISION OF BREAST LUMPECTOMY Left 07/22/2014   Procedure:  LEFT BREAST REEXCISION LUMPECTOMY;  Surgeon: Erroll Luna, MD;  Location: [facility];  Service: General;  Laterality: Left;    Family History  Problem Relation Age of Onset   Breast cancer Maternal Grandmother 7   Lung cancer Father        dx. 67s; smoker; no contact w/ father   Breast cancer Father 82   Breast  cancer Sister        dx. 95-53; metastatic   Memory loss Sister        short-term   Breast cancer Maternal Aunt 50   Cancer Paternal Uncle        kidney cancer    Breast cancer Cousin 42       bilateral w/ metastasis   Breast cancer Cousin        unknown age of dx   Breast cancer Cousin 79       mastectomy   Breast cancer Cousin 78       mastectomy   BRCA 1/2 Cousin        testing in 2015 in [place]   Kidney cancer Maternal Uncle        unknown age dx   Colon cancer Maternal Uncle        unknown age dx.   Stroke Maternal Uncle    Breast cancer Other    Cancer Paternal Uncle        colon cancer    New complaints: None today  Social history: Lives with 3 sons and husband  Controlled substance contract: n/a     Review of Systems   Constitutional:  Negative for diaphoresis.  Eyes:  Negative for pain.  Respiratory:  Negative for shortness of breath.   Cardiovascular:  Negative for chest pain, palpitations and leg swelling.  Gastrointestinal:  Negative for abdominal pain.  Endocrine: Negative for polydipsia.  Skin:  Negative for rash.  Neurological:  Negative for dizziness, weakness and headaches.  Hematological:  Does not bruise/bleed easily.  All other systems reviewed and are negative.     Objective:   Physical Exam Vitals and nursing note reviewed.  Constitutional:      General: She is not in acute distress.    Appearance: Normal appearance. She is well-developed.  HENT:     Head: Normocephalic.     Right Ear: Tympanic membrane normal.     Left Ear: Tympanic membrane normal.     Nose: Nose normal.     Mouth/Throat:     Mouth: Mucous membranes are moist.  Eyes:     Pupils: Pupils are equal, round, and reactive to light.  Neck:     Vascular: No carotid bruit or JVD.  Cardiovascular:     Rate and Rhythm: Normal rate and regular rhythm.     Heart sounds: Normal heart sounds.  Pulmonary:     Effort: Pulmonary effort is normal. No respiratory distress.     Breath sounds: Normal breath sounds. No wheezing or rales.  Chest:     Chest wall: No tenderness.  Abdominal:     General: Bowel sounds are normal. There is no distension or abdominal bruit.     Palpations: Abdomen is soft. There is no hepatomegaly, splenomegaly, mass or pulsatile mass.     Tenderness: There is no abdominal tenderness.  Musculoskeletal:        General: Normal range of motion.     Cervical back: Normal range of motion and neck supple.  Lymphadenopathy:     Cervical: No cervical adenopathy.  Skin:    General: Skin is warm and dry.  Neurological:     Mental Status: She is alert and oriented to person, place, and time.     Deep Tendon Reflexes: Reflexes are normal and symmetric.  Psychiatric:        Behavior: Behavior normal.         Thought Content: Thought content  normal.        Judgment: Judgment normal.    BP 128/82   Pulse 91   Temp 98.2 F (36.8 C) (Temporal)   Resp 20   Ht _0  (1.651 m)   Wt 241 lb (109.3 kg)   SpO2 100%   BMI 40.10 kg/m        Assessment & Plan:  Amanda Kelly comes in today with chief complaint of Medical Management of Chronic Issues   Diagnosis and orders addressed:  1. Primary hypertension Low sodium diet - amLODipine (NORVASC) 5 MG tablet; Take 1 tablet (5 mg total) by mouth daily.  Dispense: 90 tablet; Refill: 1 - lisinopril (ZESTRIL) 20 MG tablet; Take 2 tablets (40 mg total) by mouth daily.  Dispense: 180 tablet; Refill: 1  2. Mixed hyperlipidemia Low fat diet - atorvastatin (LIPITOR) 40 MG tablet; Take 1 tablet (40 mg total) by mouth daily.  Dispense: 90 tablet; Refill: 1  3. GERD without esophagitis Avoid spicy foods Do not eat 2 hours prior to bedtime  4. ANXIETY DEPRESSION Stress management  5. BMI 40.0-44.9, adult ([place]) Discussed diet and exercise for person with BMI >25 Will recheck weight in 3-6 months   6. Peripheral edema Elevate legs when sitting - furosemide (LASIX) 20 MG tablet; Take 1 tablet (20 mg total) by mouth daily.  Dispense: 90 tablet; Refill: 1  7. Tachycardia Avoid caffeine - metoprolol succinate (TOPROL-XL) 100 MG 24 hr tablet; TAKE 1 TABLET BY MOUTH ONCE DAILY WITH OR IMMEDIATELY FOLLOWING A MEAL  Dispense: 90 tablet; Refill: 1   Labs pending Health Maintenance reviewed Diet and exercise encouraged  Follow up plan: 6 months   Mary-Margaret Hassell Done, FNP

## 2020-09-14 LAB — CMP14+EGFR
ALT: 25 IU/L (ref 0–32)
AST: 25 IU/L (ref 0–40)
Albumin/Globulin Ratio: 1.7 (ref 1.2–2.2)
Albumin: 4.7 g/dL (ref 3.8–4.9)
Alkaline Phosphatase: 100 IU/L (ref 44–121)
BUN/Creatinine Ratio: 16 (ref 9–23)
BUN: 11 mg/dL (ref 6–24)
Bilirubin Total: 0.4 mg/dL (ref 0.0–1.2)
CO2: 21 mmol/L (ref 20–29)
Calcium: 10.1 mg/dL (ref 8.7–10.2)
Chloride: 91 mmol/L — ABNORMAL LOW (ref 96–106)
Creatinine, Ser: 0.67 mg/dL (ref 0.57–1.00)
Globulin, Total: 2.8 g/dL (ref 1.5–4.5)
Glucose: 85 mg/dL (ref 65–99)
Potassium: 4.4 mmol/L (ref 3.5–5.2)
Sodium: 133 mmol/L — ABNORMAL LOW (ref 134–144)
Total Protein: 7.5 g/dL (ref 6.0–8.5)
eGFR: 102 mL/min/{1.73_m2} (ref >59)

## 2020-09-14 LAB — CBC WITH DIFFERENTIAL/PLATELET
Basophils Absolute: 0 10*3/uL (ref 0.0–0.2)
Basos: 0 %
EOS (ABSOLUTE): 0.1 10*3/uL (ref 0.0–0.4)
Eos: 1 %
Hematocrit: 33.5 % — ABNORMAL LOW (ref 34.0–46.6)
Hemoglobin: 11.9 g/dL (ref 11.1–15.9)
Immature Grans (Abs): 0 10*3/uL (ref 0.0–0.1)
Immature Granulocytes: 0 %
Lymphocytes Absolute: 1.9 10*3/uL (ref 0.7–3.1)
Lymphs: 26 %
MCH: 33.6 pg — ABNORMAL HIGH (ref 26.6–33.0)
MCHC: 35.5 g/dL (ref 31.5–35.7)
MCV: 95 fL (ref 79–97)
Monocytes Absolute: 0.5 10*3/uL (ref 0.1–0.9)
Monocytes: 7 %
Neutrophils Absolute: 4.7 10*3/uL (ref 1.4–7.0)
Neutrophils: 66 %
Platelets: 272 10*3/uL (ref 150–450)
RBC: 3.54 x10E6/uL — ABNORMAL LOW (ref 3.77–5.28)
RDW: 11 % — ABNORMAL LOW (ref 11.7–15.4)
WBC: 7.1 10*3/uL (ref 3.4–10.8)

## 2020-09-14 LAB — LIPID PANEL
Chol/HDL Ratio: 2.3 ratio (ref 0.0–4.4)
Cholesterol, Total: 178 mg/dL (ref 100–199)
HDL: 78 mg/dL (ref >39)
LDL Chol Calc (NIH): 77 mg/dL (ref 0–99)
Triglycerides: 136 mg/dL (ref 0–149)
VLDL Cholesterol Cal: 23 mg/dL (ref 5–40)

## 2020-11-01 ENCOUNTER — Other Ambulatory Visit: Payer: Self-pay | Admitting: Nurse Practitioner

## 2020-11-01 ENCOUNTER — Other Ambulatory Visit: Payer: Self-pay

## 2020-11-01 ENCOUNTER — Ambulatory Visit
Admission: RE | Admit: 2020-11-01 | Discharge: 2020-11-01 | Disposition: A | Discharge status: Home / Self Care | Payer: BC Managed Care – PPO | Source: Ambulatory Visit | Attending: Nurse Practitioner | Admitting: Nurse Practitioner

## 2020-11-01 DIAGNOSIS — Z1231 Encounter for screening mammogram for malignant neoplasm of breast: Secondary | ICD-10-CM

## 2021-03-05 ENCOUNTER — Other Ambulatory Visit: Payer: Self-pay | Admitting: Nurse Practitioner

## 2021-03-05 DIAGNOSIS — I10 Essential (primary) hypertension: Secondary | ICD-10-CM

## 2021-03-05 DIAGNOSIS — E782 Mixed hyperlipidemia: Secondary | ICD-10-CM

## 2021-03-05 DIAGNOSIS — F341 Dysthymic disorder: Secondary | ICD-10-CM

## 2021-03-05 DIAGNOSIS — R609 Edema, unspecified: Secondary | ICD-10-CM

## 2021-03-06 ENCOUNTER — Other Ambulatory Visit: Payer: Self-pay | Admitting: Nurse Practitioner

## 2021-03-06 DIAGNOSIS — I10 Essential (primary) hypertension: Secondary | ICD-10-CM

## 2021-03-15 ENCOUNTER — Encounter: Payer: Self-pay | Admitting: Nurse Practitioner

## 2021-03-15 ENCOUNTER — Ambulatory Visit: Payer: BC Managed Care – PPO | Admitting: Nurse Practitioner

## 2021-03-15 VITALS — BP 132/78 | HR 83 | Temp 97.7°F | Resp 20 | Ht 65.0 in | Wt 237.0 lb

## 2021-03-15 DIAGNOSIS — R609 Edema, unspecified: Secondary | ICD-10-CM

## 2021-03-15 DIAGNOSIS — I10 Essential (primary) hypertension: Secondary | ICD-10-CM

## 2021-03-15 DIAGNOSIS — F341 Dysthymic disorder: Secondary | ICD-10-CM | POA: Diagnosis not present

## 2021-03-15 DIAGNOSIS — Z23 Encounter for immunization: Secondary | ICD-10-CM

## 2021-03-15 DIAGNOSIS — E782 Mixed hyperlipidemia: Secondary | ICD-10-CM | POA: Diagnosis not present

## 2021-03-15 DIAGNOSIS — K219 Gastro-esophageal reflux disease without esophagitis: Secondary | ICD-10-CM

## 2021-03-15 DIAGNOSIS — Z87891 Personal history of nicotine dependence: Secondary | ICD-10-CM

## 2021-03-15 DIAGNOSIS — Z6841 Body Mass Index (BMI) 40.0 and over, adult: Secondary | ICD-10-CM

## 2021-03-15 MED ORDER — FUROSEMIDE 20 MG PO TABS: 20.0000 mg | ORAL_TABLET | Freq: Every day | ORAL | 1 refills | Status: DC

## 2021-03-15 MED ORDER — LISINOPRIL 20 MG PO TABS: 20.0000 mg | ORAL_TABLET | Freq: Every day | ORAL | 1 refills | Status: DC

## 2021-03-15 MED ORDER — DULOXETINE HCL 60 MG PO CPEP: ORAL_CAPSULE | ORAL | 1 refills | Status: DC

## 2021-03-15 MED ORDER — AMLODIPINE BESYLATE 5 MG PO TABS: 5.0000 mg | ORAL_TABLET | Freq: Every day | ORAL | 1 refills | Status: DC

## 2021-03-15 MED ORDER — ATORVASTATIN CALCIUM 40 MG PO TABS: 40.0000 mg | ORAL_TABLET | Freq: Every day | ORAL | 1 refills | Status: DC

## 2021-03-15 MED ORDER — OMEPRAZOLE 20 MG PO CPDR: 20.0000 mg | DELAYED_RELEASE_CAPSULE | Freq: Every day | ORAL | 1 refills | Status: DC

## 2021-03-15 NOTE — Patient Instructions (Signed)
Peripheral Edema °Peripheral edema is swelling that is caused by a buildup of fluid. Peripheral edema most often affects the lower legs, ankles, and feet. It can also develop in the arms, hands, and face. The area of the body that has peripheral edema will look swollen. It may also feel heavy or warm. Your clothes may start to feel tight. Pressing on the area may make a temporary dent in your skin. You may not be able to move your swollen arm or leg as much as usual. °There are many causes of peripheral edema. It can happen because of a complication of other conditions such as congestive heart failure, kidney disease, or a problem with your blood circulation. It also can be a side effect of certain medicines or because of an infection. It often happens to women during pregnancy. Sometimes, the cause is not known. °Follow these instructions at home: °Managing pain, stiffness, and swelling ° °Raise (elevate) your legs while you are sitting or lying down. °Move around often to prevent stiffness and to lessen swelling. °Do not sit or stand for long periods of time. °Wear support stockings as told by your health care provider. °Medicines °Take over-the-counter and prescription medicines only as told by your health care provider. °Your health care provider may prescribe medicine to help your body get rid of excess water (diuretic). °General instructions °Pay attention to any changes in your symptoms. °Follow instructions from your health care provider about limiting salt (sodium) in your diet. Sometimes, eating less salt may reduce swelling. °Moisturize skin daily to help prevent skin from cracking and draining. °Keep all follow-up visits as told by your health care provider. This is important. °Contact a health care provider if you have: °A fever. °Edema that starts suddenly or is getting worse, especially if you are pregnant or have a medical condition. °Swelling in only one leg. °Increased swelling, redness, or pain in  one or both of your legs. °Drainage or sores at the area where you have edema. °Get help right away if you: °Develop shortness of breath, especially when you are lying down. °Have pain in your chest or abdomen. °Feel weak. °Feel faint. °Summary °Peripheral edema is swelling that is caused by a buildup of fluid. Peripheral edema most often affects the lower legs, ankles, and feet. °Move around often to prevent stiffness and to lessen swelling. Do not sit or stand for long periods of time. °Pay attention to any changes in your symptoms. °Contact a health care provider if you have edema that starts suddenly or is getting worse, especially if you are pregnant or have a medical condition. °Get help right away if you develop shortness of breath, especially when lying down. °This information is not intended to replace advice given to you by your health care provider. Make sure you discuss any questions you have with your health care provider. °Document Revised: 08/12/2020 Document Reviewed: 12/05/2017 °Elsevier Patient Education © 2022 [employer] ° °

## 2021-03-15 NOTE — Progress Notes (Signed)
Subjective:    Patient ID: Amanda Kelly, female    DOB: October 13, 1960, 60 y.o.   MRN: [id number]   Chief Complaint: No chief complaint on file.    HPI:  Amanda Kelly is a 60 y.o. who identifies as a female who was assigned female at birth.   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 Primary hypertension No c/o chest pain, sob or headache. Does not check blood pressure at home. BP Readings from Last 3 Encounters:  09/13/20 128/82  03/11/20 118/82  10/22/18 124/78     2. Mixed hyperlipidemia Does not really watch diet very closely and does no dedicated exercise. Lab Results  Component Value Date   CHOL 178 09/13/2020   HDL 78 09/13/2020   LDLCALC 77 09/13/2020   TRIG 136 09/13/2020   CHOLHDL 2.3 09/13/2020   The 10-year ASCVD risk score (Arnett DK, et al., 2019) is: 3.2%   3. GERD without esophagitis Is on omperozole daily and is doing well  4. ANXIETY DEPRESSION Is on cymbalta and is working well. GAD 7 : Generalized Anxiety Score 03/15/2021 09/13/2020 03/11/2020 04/24/2019  Nervous, Anxious, on Edge 0 0 0 1  Control/stop worrying 0 0 0 0  Worry too much - different things 0 0 0 0  Trouble relaxing 0 0 0 0  Restless 0 0 0 0  Easily annoyed or irritable 0 0 0 0  Afraid - awful might happen 0 0 0 0  Total GAD 7 Score 0 0 0 1  Anxiety Difficulty Not difficult at all Not difficult at all Not difficult at all Somewhat difficult      5. Hx of smoking Quit smoking 5-6 years ago  6. BMI 40.0-44.9, adult ([facility]) Weight is down 4lbs Wt Readings from Last 3 Encounters:  03/15/21 237 lb (107.5 kg)  09/13/20 241 lb (109.3 kg)  03/11/20 246 lb (111.6 kg)   BMI Readings from Last 3 Encounters:  03/15/21 39.44 kg/m  09/13/20 40.10 kg/m  03/11/20 40.94 kg/m     New complaints: None otday  No Known Allergies Outpatient Encounter  Medications as of 03/15/2021  Medication Sig   amLODipine (NORVASC) 5 MG tablet TAKE 1 TABLET (5 MG TOTAL) BY MOUTH DAILY.   atorvastatin (LIPITOR) 40 MG tablet TAKE 1 TABLET BY MOUTH EVERY DAY   DULoxetine (CYMBALTA) 60 MG capsule TAKE 1 CAPSULE BY MOUTH EVERY DAY   furosemide (LASIX) 20 MG tablet TAKE 1 TABLET BY MOUTH EVERY DAY   lisinopril (ZESTRIL) 20 MG tablet TAKE 1 TABLET BY MOUTH EVERY DAY   metoprolol succinate (TOPROL-XL) 100 MG 24 hr tablet TAKE 1 TABLET BY MOUTH ONCE DAILY WITH OR IMMEDIATELY FOLLOWING A MEAL   omeprazole (PRILOSEC) 20 MG capsule TAKE 1 CAPSULE BY MOUTH EVERY DAY   No facility-administered encounter medications on file as of 03/15/2021.    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 71s; smoker; no contact w/ father   Breast cancer Sister        dx. 70-53; metastatic   Memory loss Sister        short-term   Breast cancer Maternal Aunt 63   Kidney cancer Maternal Uncle        unknown age dx   Colon cancer Maternal Uncle        unknown age dx.   Stroke Maternal Uncle    Cancer Paternal Uncle        kidney cancer    Cancer Paternal Uncle        colon cancer   Breast cancer Maternal Grandmother 87   Breast cancer  Cousin 37       bilateral w/ metastasis   Breast cancer Cousin        unknown age of dx   Breast cancer Cousin 50       mastectomy   Breast cancer Cousin 61       mastectomy   BRCA 1/2 Cousin        testing in 2015 in [place]   Breast cancer Other       Controlled substance contract: n/a     Review of Systems  Constitutional:  Negative for diaphoresis.  Eyes:  Negative for pain.  Respiratory:  Negative for shortness of breath.   Cardiovascular:  Negative for chest pain, palpitations and leg swelling.  Gastrointestinal:  Negative for abdominal pain.  Endocrine: Negative for polydipsia.  Skin:  Negative for rash.  Neurological:  Negative for dizziness, weakness and headaches.  Hematological:  Does not bruise/bleed easily.  All other systems reviewed and are negative.     Objective:   Physical Exam Vitals and nursing note reviewed.  Constitutional:      General: She is not in acute distress.    Appearance: Normal appearance. She is well-developed.  HENT:     Head: Normocephalic.     Right Ear: Tympanic membrane normal.     Left Ear: Tympanic membrane normal.     Nose: Nose normal.     Mouth/Throat:     Mouth: Mucous membranes are moist.  Eyes:     Pupils: Pupils are equal, round, and reactive to light.  Neck:     Vascular: No carotid bruit or JVD.  Cardiovascular:     Rate and Rhythm: Normal rate and regular rhythm.     Heart sounds: Normal heart sounds.  Pulmonary:     Effort: Pulmonary effort is normal. No respiratory distress.     Breath sounds: Normal breath sounds. No wheezing or rales.  Chest:     Chest wall: No tenderness.  Abdominal:     General: Bowel sounds are normal. There is no distension or abdominal bruit.     Palpations: Abdomen is soft. There is no hepatomegaly, splenomegaly, mass or pulsatile mass.     Tenderness: There is no abdominal tenderness.  Musculoskeletal:        General: Normal range of motion.     Cervical back: Normal range of  motion and neck supple.  Lymphadenopathy:     Cervical: No cervical adenopathy.  Skin:    General: Skin is warm and dry.  Neurological:     Mental Status: She is alert and oriented to person, place, and time.     Deep Tendon Reflexes: Reflexes are normal and symmetric.  Psychiatric:        Behavior: Behavior normal.        Thought Content: Thought content normal.        Judgment: Judgment normal.    BP 132/78 (BP Location: Right Arm, Cuff Size: Large)    Pulse 83    Temp 97.7 F (36.5 C) (Temporal)    Resp 20    Ht 5' 5"  (1.651 m)    Wt 237 lb (107.5 kg)    SpO2 100%    BMI 39.44 kg/m         Assessment & Plan:  Amanda Kelly comes in today with chief complaint of No chief complaint on file.   Diagnosis and orders addressed:  1. Primary hypertension Low sodium diet - amLODipine (NORVASC) 5 MG tablet; Take 1 tablet (5 mg total) by mouth daily.  Dispense: 90 tablet; Refill: 1 - lisinopril (ZESTRIL) 20 MG tablet; Take 1 tablet (20 mg total) by mouth daily.  Dispense: 90 tablet; Refill: 1 - CBC with Differential/Platelet - CMP14+EGFR  2. Mixed hyperlipidemia Low fat diet - atorvastatin (LIPITOR) 40 MG tablet; Take 1 tablet (40 mg total) by mouth daily.  Dispense: 90 tablet; Refill: 1 - Lipid panel  3. GERD without esophagitis Avoid spicy foods Do not eat 2 hours prior to bedtime - omeprazole (PRILOSEC) 20 MG capsule; Take 1 capsule (20 mg total) by mouth daily.  Dispense: 90 capsule; Refill: 1 4. ANXIETY DEPRESSION Stress management - DULoxetine (CYMBALTA) 60 MG capsule; TAKE 1 CAPSULE BY MOUTH EVERY DAY  Dispense: 90 capsule; Refill: 1  5. Hx of smoking Do not go back to smoking  6. BMI 40.0-44.9, adult ([facility]) Discussed diet and exercise for person with BMI >25 Will recheck weight in 3-6 months   7.  Peripheral edema Elevate legs when sitting - furosemide (LASIX) 20 MG tablet; Take 1 tablet (20 mg total) by mouth daily.  Dispense: 90 tablet; Refill: 1   Labs  pending Health Maintenance reviewed Diet and exercise encouraged  Follow up plan: 6 months   Mary-Margaret Hassell Done, FNP

## 2021-03-16 LAB — CBC WITH DIFFERENTIAL/PLATELET
Basophils Absolute: 0 10*3/uL (ref 0.0–0.2)
Basos: 1 %
EOS (ABSOLUTE): 0.1 10*3/uL (ref 0.0–0.4)
Eos: 1 %
Hematocrit: 33.9 % — ABNORMAL LOW (ref 34.0–46.6)
Hemoglobin: 11.8 g/dL (ref 11.1–15.9)
Immature Grans (Abs): 0 10*3/uL (ref 0.0–0.1)
Immature Granulocytes: 0 %
Lymphocytes Absolute: 1.8 10*3/uL (ref 0.7–3.1)
Lymphs: 26 %
MCH: 33.4 pg — ABNORMAL HIGH (ref 26.6–33.0)
MCHC: 34.8 g/dL (ref 31.5–35.7)
MCV: 96 fL (ref 79–97)
Monocytes Absolute: 0.5 10*3/uL (ref 0.1–0.9)
Monocytes: 8 %
Neutrophils Absolute: 4.5 10*3/uL (ref 1.4–7.0)
Neutrophils: 64 %
Platelets: 303 10*3/uL (ref 150–450)
RBC: 3.53 x10E6/uL — ABNORMAL LOW (ref 3.77–5.28)
RDW: 11.4 % — ABNORMAL LOW (ref 11.7–15.4)
WBC: 7 10*3/uL (ref 3.4–10.8)

## 2021-03-16 LAB — CMP14+EGFR
ALT: 19 IU/L (ref 0–32)
AST: 22 IU/L (ref 0–40)
Albumin/Globulin Ratio: 2 (ref 1.2–2.2)
Albumin: 4.8 g/dL (ref 3.8–4.9)
Alkaline Phosphatase: 95 IU/L (ref 44–121)
BUN/Creatinine Ratio: 27 — ABNORMAL HIGH (ref 9–23)
BUN: 16 mg/dL (ref 6–24)
Bilirubin Total: 0.5 mg/dL (ref 0.0–1.2)
CO2: 20 mmol/L (ref 20–29)
Calcium: 9.7 mg/dL (ref 8.7–10.2)
Chloride: 94 mmol/L — ABNORMAL LOW (ref 96–106)
Creatinine, Ser: 0.6 mg/dL (ref 0.57–1.00)
Globulin, Total: 2.4 g/dL (ref 1.5–4.5)
Glucose: 86 mg/dL (ref 70–99)
Potassium: 4.1 mmol/L (ref 3.5–5.2)
Sodium: 134 mmol/L (ref 134–144)
Total Protein: 7.2 g/dL (ref 6.0–8.5)
eGFR: 104 mL/min/{1.73_m2} (ref >59)

## 2021-03-16 LAB — LIPID PANEL
Chol/HDL Ratio: 2 ratio (ref 0.0–4.4)
Cholesterol, Total: 179 mg/dL (ref 100–199)
HDL: 88 mg/dL (ref >39)
LDL Chol Calc (NIH): 70 mg/dL (ref 0–99)
Triglycerides: 123 mg/dL (ref 0–149)
VLDL Cholesterol Cal: 21 mg/dL (ref 5–40)

## 2021-03-17 ENCOUNTER — Other Ambulatory Visit: Payer: Self-pay

## 2021-03-17 ENCOUNTER — Encounter (HOSPITAL_BASED_OUTPATIENT_CLINIC_OR_DEPARTMENT_OTHER): Payer: Self-pay | Admitting: *Deleted

## 2021-03-17 ENCOUNTER — Emergency Department (HOSPITAL_BASED_OUTPATIENT_CLINIC_OR_DEPARTMENT_OTHER)
Admission: EM | Admit: 2021-03-17 | Discharge: 2021-03-17 | Disposition: A | Discharge status: Home / Self Care | Payer: BC Managed Care – PPO | Attending: Emergency Medicine | Admitting: Emergency Medicine

## 2021-03-17 DIAGNOSIS — I10 Essential (primary) hypertension: Secondary | ICD-10-CM | POA: Diagnosis not present

## 2021-03-17 DIAGNOSIS — W260XXA Contact with knife, initial encounter: Secondary | ICD-10-CM | POA: Insufficient documentation

## 2021-03-17 DIAGNOSIS — Z87891 Personal history of nicotine dependence: Secondary | ICD-10-CM | POA: Insufficient documentation

## 2021-03-17 DIAGNOSIS — S61210A Laceration without foreign body of right index finger without damage to nail, initial encounter: Secondary | ICD-10-CM | POA: Diagnosis not present

## 2021-03-17 DIAGNOSIS — Z79899 Other long term (current) drug therapy: Secondary | ICD-10-CM | POA: Diagnosis not present

## 2021-03-17 DIAGNOSIS — Z23 Encounter for immunization: Secondary | ICD-10-CM | POA: Insufficient documentation

## 2021-03-17 DIAGNOSIS — S6991XA Unspecified injury of right wrist, hand and finger(s), initial encounter: Secondary | ICD-10-CM | POA: Diagnosis not present

## 2021-03-17 DIAGNOSIS — Y93G3 Activity, cooking and baking: Secondary | ICD-10-CM | POA: Diagnosis not present

## 2021-03-17 MED ORDER — TETANUS-DIPHTH-ACELL PERTUSSIS 5-2.5-18.5 LF-MCG/0.5 IM SUSY
0.5000 mL | PREFILLED_SYRINGE | Freq: Once | INTRAMUSCULAR | Status: AC
  Administered 2021-03-17: 20:00:00 0.5 mL via INTRAMUSCULAR
  Filled 2021-03-17: qty 0.5

## 2021-03-17 MED ORDER — LIDOCAINE HCL (PF) 1 % IJ SOLN
5.0000 mL | Freq: Once | INTRAMUSCULAR | Status: DC
  Filled 2021-03-17: qty 5

## 2021-03-17 NOTE — ED Triage Notes (Signed)
Laceration to her right index finger with a sharp knife while preparing dinner tonight.

## 2021-03-17 NOTE — ED Provider Notes (Signed)
[facility] EMERGENCY DEPARTMENT Provider Note   CSN: [id number] Arrival date & time: 03/17/21  1828     History Chief Complaint  Patient presents with   Laceration    Amanda Kelly is a 60 y.o. female.  HPI Patient is a 60 year old left-hand-dominant female with a medical history as noted below.  She presents to the emergency department due to a laceration to the dorsal aspect of the right index finger.  This occurred prior to arrival.  Patient states that she was cutting onions and the knife slipped and cut the affected finger.  Reports mild bleeding from the site that improved with direct pressure.  No numbness or weakness.  Unsure of the timing of her last Tdap.    Past Medical History:  Diagnosis Date   ANXIETY DEPRESSION    GERD (gastroesophageal reflux disease)    Hyperlipidemia    Hypertension    Palpitations    Shortness of breath    SMOKER 2014   former   TACHYCARDIA    Wears glasses     Patient Active Problem List   Diagnosis Date Noted   BMI 40.0-44.9, adult ([place]) 04/21/2016   GERD without esophagitis 04/21/2016   Genetic testing 02/22/2015   Family history of breast cancer in female 01/26/2015   Hypertension 03/05/2013   Hyperlipidemia 03/05/2013   Hx of smoking 07/07/2008   ANXIETY DEPRESSION 07/03/2008    Past Surgical History:  Procedure Laterality Date   BREAST BIOPSY Right    BREAST EXCISIONAL BIOPSY Left    BREAST EXCISIONAL BIOPSY Left    BREAST LUMPECTOMY WITH RADIOACTIVE SEED LOCALIZATION Left 07/07/2014   Procedure: LEFT BREAST LUMPECTOMY WITH RADIOACTIVE SEED LOCALIZATION;  Surgeon: Erroll Luna, MD;  Location: [facility];  Service: General;  Laterality: Left;   BREAST MASS EXCISION Left 2009   left-papaloma   COLONOSCOPY     ENDOMETRIAL ABLATION  2010   RE-EXCISION OF BREAST LUMPECTOMY Left 07/22/2014   Procedure:  LEFT BREAST REEXCISION LUMPECTOMY;  Surgeon: Erroll Luna, MD;  Location: [facility];  Service: General;  Laterality: Left;     OB History   No obstetric history on file.     Family History  Problem Relation Age of Onset   Cancer Father    Lung cancer Father        dx. 54s; smoker; no contact w/ father   Breast cancer Sister        dx. 64-53; metastatic   Memory loss Sister        short-term   Breast cancer Maternal Aunt 28   Kidney cancer Maternal Uncle        unknown age dx   Colon cancer Maternal Uncle        unknown age dx.   Stroke Maternal Uncle    Cancer Paternal Uncle        kidney cancer    Cancer Paternal Uncle        colon cancer   Breast cancer Maternal Grandmother 46   Breast cancer Cousin 54       bilateral w/ metastasis   Breast cancer Cousin        unknown age of dx   Breast cancer Cousin 80       mastectomy   Breast cancer Cousin 93       mastectomy   BRCA 1/2 Cousin        testing in 2015 in [place]   Breast cancer  Other     Social History   Tobacco Use   Smoking status: Former    Packs/day: 1.00    Years: 20.00    Pack years: 20.00    Types: Cigarettes    Quit date: 03/27/2008    Years since quitting: 12.9   Smokeless tobacco: Never  Vaping Use   Vaping Use: Never used  Substance Use Topics   Alcohol use: Yes    Alcohol/week: 2.0 standard drinks    Types: 2 Glasses of wine per week    Comment: 1-2 a week    Drug use: No    Home Medications Prior to Admission medications   Medication Sig Start Date End Date Taking? Authorizing Provider  amLODipine (NORVASC) 5 MG tablet Take 1 tablet (5 mg total) by mouth daily. 03/15/21   Hassell Done, Mary-Margaret, FNP  atorvastatin (LIPITOR) 40 MG tablet Take 1 tablet (40 mg total) by mouth daily. 03/15/21   Chevis Pretty, FNP  DULoxetine (CYMBALTA) 60 MG capsule TAKE 1 CAPSULE BY MOUTH EVERY DAY 03/15/21   Hassell Done, Mary-Margaret, FNP  furosemide (LASIX) 20 MG tablet Take 1 tablet (20 mg total) by mouth daily. 03/15/21   Hassell Done, Mary-Margaret, FNP  lisinopril (ZESTRIL)  20 MG tablet Take 1 tablet (20 mg total) by mouth daily. 03/15/21   Hassell Done Mary-Margaret, FNP  metoprolol succinate (TOPROL-XL) 100 MG 24 hr tablet TAKE 1 TABLET BY MOUTH ONCE DAILY WITH OR IMMEDIATELY FOLLOWING A MEAL 09/13/20   Hassell Done, Mary-Margaret, FNP  omeprazole (PRILOSEC) 20 MG capsule Take 1 capsule (20 mg total) by mouth daily. 03/15/21   Chevis Pretty, FNP    Allergies    Patient has no known allergies.  Review of Systems   Review of Systems  All other systems reviewed and are negative. Ten systems reviewed and are negative for acute change, except as noted in the HPI.   Physical Exam Updated Vital Signs BP 113/75 (BP Location: Left Arm)    Pulse 81    Temp 98.2 F (36.8 C) (Oral)    Resp 18    Ht _0  (1.651 m)    Wt 107.5 kg    SpO2 96%    BMI 39.44 kg/m   Physical Exam Vitals and nursing note reviewed.  Constitutional:      General: She is not in acute distress.    Appearance: Normal appearance. She is well-developed.  HENT:     Head: Normocephalic and atraumatic.     Right Ear: External ear normal.     Left Ear: External ear normal.     Mouth/Throat:     Pharynx: Oropharynx is clear.  Eyes:     General: No scleral icterus.       Right eye: No discharge.        Left eye: No discharge.     Conjunctiva/sclera: Conjunctivae normal.  Neck:     Trachea: No tracheal deviation.  Cardiovascular:     Rate and Rhythm: Normal rate.     Pulses: Normal pulses.  Pulmonary:     Effort: Pulmonary effort is normal. No respiratory distress.     Breath sounds: No stridor.  Abdominal:     General: There is no distension.  Musculoskeletal:        General: No swelling or deformity.     Cervical back: Neck supple.  Skin:    General: Skin is warm and dry.     Findings: No rash.     Comments: 5 cm L-shaped laceration noted to  the dorsal aspect of the proximal phalanx of the right index finger.  Full range of motion of the affected finger.  Strength is 5/5 with resisted  flexion and extension of the finger.  Good cap refill.  Distal sensation intact.  2+ radial pulse.  Neurological:     General: No focal deficit present.     Mental Status: She is alert and oriented to person, place, and time.     Cranial Nerves: Cranial nerve deficit: no gross deficits.   ED Results / Procedures / Treatments   Labs (all labs ordered are listed, but only abnormal results are displayed) Labs Reviewed - No data to display  EKG None  Radiology No results found.  Procedures .Marland KitchenLaceration Repair  Date/Time: 03/17/2021 8:02 PM Performed by: Rayna Sexton, PA-C Authorized by: Rayna Sexton, PA-C   Consent:    Consent obtained:  Verbal   Consent given by:  Patient   Risks discussed:  Infection, need for additional repair, pain, poor cosmetic result and poor wound healing   Alternatives discussed:  No treatment and delayed treatment Universal protocol:    Procedure explained and questions answered to patient or proxy's satisfaction: yes     Relevant documents present and verified: yes     Test results available: yes     Imaging studies available: yes     Required blood products, implants, devices, and special equipment available: yes     Site/side marked: yes     Immediately prior to procedure, a time out was called: yes     Patient identity confirmed:  Verbally with patient Anesthesia:    Anesthesia method:  Local infiltration   Local anesthetic:  Lidocaine 1% w/o epi Laceration details:    Location: Right index finger.   Length (cm):  5 Pre-procedure details:    Preparation:  Patient was prepped and draped in usual sterile fashion Treatment:    Area cleansed with:  Soap and water and povidone-iodine   Amount of cleaning:  Extensive Skin repair:    Repair method:  Sutures   Suture size:  5-0   Suture material:  Prolene   Suture technique:  Simple interrupted   Number of sutures:  5 Approximation:    Approximation:  Close Repair type:    Repair  type:  Simple Post-procedure details:    Dressing:  Antibiotic ointment and sterile dressing   Procedure completion:  Tolerated well, no immediate complications   Medications Ordered in ED Medications  lidocaine (PF) (XYLOCAINE) 1 % injection 5 mL (has no administration in time range)  Tdap (BOOSTRIX) injection 0.5 mL (has no administration in time range)   ED Course  I have reviewed the triage vital signs and the nursing notes.  Pertinent labs & imaging results that were available during my care of the patient were reviewed by me and considered in my medical decision making (see chart for details).    MDM Rules/Calculators/A&P                          Pt is a 60 y.o. female who presents to the emergency department due to a laceration to the right index finger that occurred prior to arrival.  Patient was cutting an onion with a sharp knife and accidentally cut the affected finger.  Patient with a 5 cm L-shaped laceration to the dorsum of the finger.  Mild bleeding that improved with direct pressure.  Neurovascularly intact distal to the injury.  Full  range of motion of the finger.  No deficits noted with resisted flexion and extension in the finger.  Wound was cleaned extensively and closed with Prolene sutures.  Please see the procedure note above for additional details.  Recommended suture removal in 7 to 10 days.  We discussed wound care in length.  Discussed return precautions.  Feel that she is stable for discharge at this time and she is agreeable.  Her questions were answered and she was amicable at the time of discharge.  Note: Portions of this report may have been transcribed using voice recognition software. Every effort was made to ensure accuracy; however, inadvertent computerized transcription errors may be present.   Final Clinical Impression(s) / ED Diagnoses Final diagnoses:  Laceration of right index finger without foreign body without damage to nail, initial encounter    Rx / DC Orders ED Discharge Orders     None        Rayna Sexton, PA-C 03/17/21 2005    Tegeler, Gwenyth Allegra, MD 03/18/21 [phone]

## 2021-03-17 NOTE — Discharge Instructions (Addendum)
Please apply triple antibiotic to your cut 1-2 times per day.  Please have your stitches removed in 7 to 10 days.  If you develop any pain, swelling, redness, discharge from the site, fevers, vomiting, please come back to the emergency department immediately for reevaluation of the cut.  It was a pleasure to meet you.

## 2021-05-25 ENCOUNTER — Other Ambulatory Visit: Payer: Self-pay | Admitting: Nurse Practitioner

## 2021-05-25 DIAGNOSIS — R000 Tachycardia, unspecified: Secondary | ICD-10-CM

## 2021-08-06 ENCOUNTER — Other Ambulatory Visit: Payer: Self-pay | Admitting: Nurse Practitioner

## 2021-08-06 DIAGNOSIS — I10 Essential (primary) hypertension: Secondary | ICD-10-CM

## 2021-08-20 ENCOUNTER — Other Ambulatory Visit: Payer: Self-pay | Admitting: Nurse Practitioner

## 2021-08-20 DIAGNOSIS — R000 Tachycardia, unspecified: Secondary | ICD-10-CM

## 2021-09-21 ENCOUNTER — Other Ambulatory Visit: Payer: Self-pay | Admitting: Nurse Practitioner

## 2021-09-21 DIAGNOSIS — R000 Tachycardia, unspecified: Secondary | ICD-10-CM

## 2021-09-23 ENCOUNTER — Encounter: Payer: Self-pay | Admitting: Nurse Practitioner

## 2021-09-23 ENCOUNTER — Ambulatory Visit: Payer: BC Managed Care – PPO | Admitting: Nurse Practitioner

## 2021-09-23 VITALS — BP 130/75 | HR 93 | Temp 97.8°F | Resp 20 | Ht 65.0 in | Wt 236.0 lb

## 2021-09-23 DIAGNOSIS — I10 Essential (primary) hypertension: Secondary | ICD-10-CM | POA: Diagnosis not present

## 2021-09-23 DIAGNOSIS — R609 Edema, unspecified: Secondary | ICD-10-CM

## 2021-09-23 DIAGNOSIS — K219 Gastro-esophageal reflux disease without esophagitis: Secondary | ICD-10-CM

## 2021-09-23 DIAGNOSIS — R600 Localized edema: Secondary | ICD-10-CM | POA: Insufficient documentation

## 2021-09-23 DIAGNOSIS — F341 Dysthymic disorder: Secondary | ICD-10-CM | POA: Diagnosis not present

## 2021-09-23 DIAGNOSIS — R000 Tachycardia, unspecified: Secondary | ICD-10-CM

## 2021-09-23 DIAGNOSIS — E782 Mixed hyperlipidemia: Secondary | ICD-10-CM | POA: Diagnosis not present

## 2021-09-23 DIAGNOSIS — Z87891 Personal history of nicotine dependence: Secondary | ICD-10-CM

## 2021-09-23 DIAGNOSIS — Z6841 Body Mass Index (BMI) 40.0 and over, adult: Secondary | ICD-10-CM

## 2021-09-23 MED ORDER — METOPROLOL SUCCINATE ER 100 MG PO TB24: ORAL_TABLET | ORAL | 0 refills | Status: DC

## 2021-09-23 MED ORDER — AMLODIPINE BESYLATE 5 MG PO TABS: 5.0000 mg | ORAL_TABLET | Freq: Every day | ORAL | 1 refills | Status: DC

## 2021-09-23 MED ORDER — LISINOPRIL 20 MG PO TABS: 40.0000 mg | ORAL_TABLET | Freq: Every day | ORAL | 1 refills | Status: DC

## 2021-09-23 MED ORDER — FUROSEMIDE 20 MG PO TABS: 20.0000 mg | ORAL_TABLET | Freq: Two times a day (BID) | ORAL | 1 refills | Status: DC

## 2021-09-23 MED ORDER — DULOXETINE HCL 60 MG PO CPEP: ORAL_CAPSULE | ORAL | 1 refills | Status: DC

## 2021-09-23 MED ORDER — ATORVASTATIN CALCIUM 40 MG PO TABS: 40.0000 mg | ORAL_TABLET | Freq: Every day | ORAL | 1 refills | Status: DC

## 2021-09-23 NOTE — Patient Instructions (Signed)
Peripheral Edema  Peripheral edema is swelling that is caused by a buildup of fluid. Peripheral edema most often affects the lower legs, ankles, and feet. It can also develop in the arms, hands, and face. The area of the body that has peripheral edema will look swollen. It may also feel heavy or warm. Your clothes may start to feel tight. Pressing on the area may make a temporary dent in your skin (pitting edema). You may not be able to move your swollen arm or leg as much as usual. There are many causes of peripheral edema. It can happen because of a complication of other conditions such as heart failure, kidney disease, or a problem with your circulation. It also can be a side effect of certain medicines or happen because of an infection. It often happens to women during pregnancy. Sometimes, the cause is not known. Follow these instructions at home: Managing pain, stiffness, and swelling  Raise (elevate) your legs while you are sitting or lying down. Move around often to prevent stiffness and to reduce swelling. Do not sit or stand for long periods of time. Do not wear tight clothing. Do not wear garters on your upper legs. Exercise your legs to get your circulation going. This helps to move the fluid back into your blood vessels, and it may help the swelling go down. Wear compression stockings as told by your health care provider. These stockings help to prevent blood clots and reduce swelling in your legs. It is important that these are the correct size. These stockings should be prescribed by your doctor to prevent possible injuries. If elastic bandages or wraps are recommended, use them as told by your health care provider. Medicines Take over-the-counter and prescription medicines only as told by your health care provider. Your health care provider may prescribe medicine to help your body get rid of excess water (diuretic). Take this medicine if you are told to take it. General  instructions Eat a low-salt (low-sodium) diet as told by your health care provider. Sometimes, eating less salt may reduce swelling. Pay attention to any changes in your symptoms. Moisturize your skin daily to help prevent skin from cracking and draining. Keep all follow-up visits. This is important. Contact a health care provider if: You have a fever. You have swelling in only one leg. You have increased swelling, redness, or pain in one or both of your legs. You have drainage or sores at the area where you have edema. Get help right away if: You have edema that starts suddenly or is getting worse, especially if you are pregnant or have a medical condition. You develop shortness of breath, especially when you are lying down. You have pain in your chest or abdomen. You feel weak. You feel like you will faint. These symptoms may be an emergency. Get help right away. Call 911. Do not wait to see if the symptoms will go away. Do not drive yourself to the hospital. Summary Peripheral edema is swelling that is caused by a buildup of fluid. Peripheral edema most often affects the lower legs, ankles, and feet. Move around often to prevent stiffness and to reduce swelling. Do not sit or stand for long periods of time. Pay attention to any changes in your symptoms. Contact a health care provider if you have edema that starts suddenly or is getting worse, especially if you are pregnant or have a medical condition. Get help right away if you develop shortness of breath, especially when lying down.   This information is not intended to replace advice given to you by your health care provider. Make sure you discuss any questions you have with your health care provider. Document Revised: 11/15/2020 Document Reviewed: 11/15/2020 Elsevier Patient Education  2023 [employer]  

## 2021-09-23 NOTE — Addendum Note (Signed)
Addended by: Rolena Infante on: 09/23/2021 03:15 PM   Modules accepted: Orders

## 2021-09-23 NOTE — Progress Notes (Signed)
Subjective:    Patient ID: Amanda Kelly, female    DOB: June 24, 1960, 61 y.o.   MRN: [id number]   Chief Complaint: medical management of chronic issues     HPI:  Amanda Kelly is a 61 y.o. who identifies as a female who was assigned female at birth.   Social history: Lives with: husband and 3 sons Work history: works part time.   Comes in today for follow up of the following chronic medical issues:  1. Primary hypertension No c/o chest pain, sob or headache. Does not check blood pressure at home. BP Readings from Last 3 Encounters:  03/17/21 113/74  03/15/21 132/78  09/13/20 128/82      2. Mixed hyperlipidemia Does not really watch diet. Does very little exercise. Lab Results  Component Value Date   CHOL 179 03/15/2021   HDL 88 03/15/2021   LDLCALC 70 03/15/2021   TRIG 123 03/15/2021   CHOLHDL 2.0 03/15/2021   The 10-year ASCVD risk score (Arnett DK, et al., 2019) is: 2.3%   3. GERD without esophagitis Is on omperazole daily and is doing well.  4. ANXIETY DEPRESSION Has some anxiety but is doing well.    09/23/2021    2:52 PM 03/15/2021    3:45 PM 09/13/2020    4:05 PM 03/11/2020    3:21 PM  GAD 7 : Generalized Anxiety Score  Nervous, Anxious, on Edge 0 0 0 0  Control/stop worrying 0 0 0 0  Worry too much - different things 0 0 0 0  Trouble relaxing 0 0 0 0  Restless 0 0 0 0  Easily annoyed or irritable 1 0 0 0  Afraid - awful might happen 0 0 0 0  Total GAD 7 Score 1 0 0 0  Anxiety Difficulty Not difficult at all Not difficult at all Not difficult at all Not difficult at all      5. Hx of smoking Still not smoking  6. BMI 40.0-44.9, adult (HCC) No recent weight changes Wt Readings from Last 3 Encounters:  09/23/21 236 lb (107 kg)  03/17/21 236 lb 15.9 oz (107.5 kg)  03/15/21 237 lb (107.5 kg)   BMI Readings from Last 3 Encounters:  09/23/21 39.27 kg/m  03/17/21 39.44 kg/m  03/15/21 39.44 kg/m     New complaints: Having  peripheral edema.  No Known Allergies Outpatient Encounter Medications as of 09/23/2021  Medication Sig   amLODipine (NORVASC) 5 MG tablet Take 1 tablet (5 mg total) by mouth daily.   atorvastatin (LIPITOR) 40 MG tablet Take 1 tablet (40 mg total) by mouth daily.   DULoxetine (CYMBALTA) 60 MG capsule TAKE 1 CAPSULE BY MOUTH EVERY DAY   furosemide (LASIX) 20 MG tablet Take 1 tablet (20 mg total) by mouth daily.   lisinopril (ZESTRIL) 20 MG tablet TAKE 2 TABLETS (40 MG TOTAL) BY MOUTH DAILY.   metoprolol succinate (TOPROL-XL) 100 MG 24 hr tablet TAKE 1 TABLET BY MOUTH ONCE DAILY WITH OR IMMEDIATELY FOLLOWING A MEAL   omeprazole (PRILOSEC) 20 MG capsule Take 1 capsule (20 mg total) by mouth daily.   No facility-administered encounter medications on file as of 09/23/2021.    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 67s; smoker; no contact w/ father   Breast cancer Sister        dx. 63-53; metastatic   Memory loss Sister        short-term   Breast cancer Maternal Aunt 31   Kidney cancer Maternal Uncle        unknown age dx   Colon cancer Maternal Uncle        unknown age dx.   Stroke Maternal Uncle    Cancer Paternal Uncle        kidney cancer     Cancer Paternal Uncle        colon cancer   Breast cancer Maternal Grandmother 45   Breast cancer Cousin 67       bilateral w/ metastasis   Breast cancer Cousin        unknown age of dx   Breast cancer Cousin 40       mastectomy   Breast cancer Cousin 54       mastectomy   BRCA 1/2 Cousin        testing in 2015 in [place]   Breast cancer Other       Controlled substance contract: n/a     Review of Systems  Constitutional:  Negative for diaphoresis.  Eyes:  Negative for pain.  Respiratory:  Negative for shortness of breath.   Cardiovascular:  Negative for chest pain, palpitations and leg swelling.  Gastrointestinal:  Negative for abdominal pain.  Endocrine: Negative for polydipsia.  Skin:  Negative for rash.  Neurological:  Negative for dizziness, weakness and headaches.  Hematological:  Does not bruise/bleed easily.  All other systems reviewed and are negative.      Objective:   Physical Exam Vitals and nursing note reviewed.  Constitutional:      General: She is not in acute distress.    Appearance: Normal appearance. She is well-developed.  HENT:     Head: Normocephalic.     Right Ear: Tympanic membrane normal.     Left Ear: Tympanic membrane normal.     Nose: Nose normal.     Mouth/Throat:     Mouth: Mucous membranes are moist.  Eyes:     Pupils: Pupils are equal, round, and reactive to light.  Neck:     Vascular: No carotid bruit or JVD.  Cardiovascular:     Rate and Rhythm: Normal rate and regular rhythm.     Heart sounds: Normal heart sounds.  Pulmonary:     Effort: Pulmonary effort is normal. No respiratory distress.     Breath sounds: Normal breath sounds. No wheezing or rales.  Chest:     Chest wall: No tenderness.  Abdominal:     General: Bowel sounds are normal. There is no distension or abdominal bruit.     Palpations: Abdomen is soft. There is no hepatomegaly, splenomegaly, mass or pulsatile mass.     Tenderness: There is no abdominal  tenderness.  Musculoskeletal:  General: Normal range of motion.     Cervical back: Normal range of motion and neck supple.     Right lower leg: Edema (2+) present.     Left lower leg: Edema (2+) present.  Lymphadenopathy:     Cervical: No cervical adenopathy.  Skin:    General: Skin is warm and dry.  Neurological:     Mental Status: She is alert and oriented to person, place, and time.     Deep Tendon Reflexes: Reflexes are normal and symmetric.  Psychiatric:        Behavior: Behavior normal.        Thought Content: Thought content normal.        Judgment: Judgment normal.     BP 130/75   Pulse 93   Temp 97.8 F (36.6 C) (Temporal)   Resp 20   Ht 5' 5"  (1.651 m)   Wt 236 lb (107 kg)   SpO2 96%   BMI 39.27 kg/m        Assessment & Plan:   Amanda Kelly comes in today with chief complaint of Medical Management of Chronic Issues   Diagnosis and orders addressed:  1. Primary hypertension Low sodium diet - lisinopril (ZESTRIL) 20 MG tablet; Take 2 tablets (40 mg total) by mouth daily.  Dispense: 180 tablet; Refill: 1 - amLODipine (NORVASC) 5 MG tablet; Take 1 tablet (5 mg total) by mouth daily.  Dispense: 90 tablet; Refill: 1  2. Mixed hyperlipidemia Low fat diet - atorvastatin (LIPITOR) 40 MG tablet; Take 1 tablet (40 mg total) by mouth daily.  Dispense: 90 tablet; Refill: 1  3. GERD without esophagitis Avoid spicy foods Do not eat 2 hours prior to bedtime  4. ANXIETY DEPRESSION Stress management - DULoxetine (CYMBALTA) 60 MG capsule; TAKE 1 CAPSULE BY MOUTH EVERY DAY  Dispense: 90 capsule; Refill: 1  5. Hx of smoking Continue to refrain from smoking  6. BMI 40.0-44.9, adult ([place]) Discussed diet and exercise for person with BMI >25 Will recheck weight in 3-6 months   7. Peripheral edema Increase lasix to BID If edema does not improve let me know - furosemide (LASIX) 20 MG tablet; Take 1 tablet (20 mg total) by mouth 2 (two) times daily.   Dispense: 180 tablet; Refill: 1  8. Tachycardia Avoid caffeine - metoprolol succinate (TOPROL-XL) 100 MG 24 hr tablet; Take with or immediately following a meal.  Dispense: 30 tablet; Refill: 0   Labs pending Health Maintenance reviewed Diet and exercise encouraged  Follow up plan: 6 months   Mary-Margaret Hassell Done, FNP

## 2021-09-24 LAB — CMP14+EGFR
ALT: 25 IU/L (ref 0–32)
AST: 28 IU/L (ref 0–40)
Albumin/Globulin Ratio: 1.8 (ref 1.2–2.2)
Albumin: 4.8 g/dL (ref 3.8–4.9)
Alkaline Phosphatase: 107 IU/L (ref 44–121)
BUN/Creatinine Ratio: 17 (ref 9–23)
BUN: 11 mg/dL (ref 6–24)
Bilirubin Total: 0.4 mg/dL (ref 0.0–1.2)
CO2: 21 mmol/L (ref 20–29)
Calcium: 10.3 mg/dL — ABNORMAL HIGH (ref 8.7–10.2)
Chloride: 96 mmol/L (ref 96–106)
Creatinine, Ser: 0.66 mg/dL (ref 0.57–1.00)
Globulin, Total: 2.6 g/dL (ref 1.5–4.5)
Glucose: 93 mg/dL (ref 70–99)
Potassium: 4.1 mmol/L (ref 3.5–5.2)
Sodium: 135 mmol/L (ref 134–144)
Total Protein: 7.4 g/dL (ref 6.0–8.5)
eGFR: 102 mL/min/{1.73_m2} (ref >59)

## 2021-09-24 LAB — LIPID PANEL
Chol/HDL Ratio: 2.2 ratio (ref 0.0–4.4)
Cholesterol, Total: 165 mg/dL (ref 100–199)
HDL: 74 mg/dL (ref >39)
LDL Chol Calc (NIH): 71 mg/dL (ref 0–99)
Triglycerides: 112 mg/dL (ref 0–149)
VLDL Cholesterol Cal: 20 mg/dL (ref 5–40)

## 2021-09-24 LAB — CBC WITH DIFFERENTIAL/PLATELET
Basophils Absolute: 0.1 10*3/uL (ref 0.0–0.2)
Basos: 1 %
EOS (ABSOLUTE): 0.1 10*3/uL (ref 0.0–0.4)
Eos: 1 %
Hematocrit: 34.3 % (ref 34.0–46.6)
Hemoglobin: 11.9 g/dL (ref 11.1–15.9)
Immature Grans (Abs): 0 10*3/uL (ref 0.0–0.1)
Immature Granulocytes: 0 %
Lymphocytes Absolute: 1.9 10*3/uL (ref 0.7–3.1)
Lymphs: 22 %
MCH: 33.4 pg — ABNORMAL HIGH (ref 26.6–33.0)
MCHC: 34.7 g/dL (ref 31.5–35.7)
MCV: 96 fL (ref 79–97)
Monocytes Absolute: 0.6 10*3/uL (ref 0.1–0.9)
Monocytes: 6 %
Neutrophils Absolute: 6.2 10*3/uL (ref 1.4–7.0)
Neutrophils: 70 %
Platelets: 290 10*3/uL (ref 150–450)
RBC: 3.56 x10E6/uL — ABNORMAL LOW (ref 3.77–5.28)
RDW: 11.1 % — ABNORMAL LOW (ref 11.7–15.4)
WBC: 8.9 10*3/uL (ref 3.4–10.8)

## 2021-10-25 ENCOUNTER — Other Ambulatory Visit: Payer: Self-pay | Admitting: Nurse Practitioner

## 2021-10-25 DIAGNOSIS — R000 Tachycardia, unspecified: Secondary | ICD-10-CM

## 2021-11-23 ENCOUNTER — Other Ambulatory Visit: Payer: Self-pay | Admitting: Nurse Practitioner

## 2021-11-23 DIAGNOSIS — R000 Tachycardia, unspecified: Secondary | ICD-10-CM

## 2021-11-24 MED ORDER — METOPROLOL SUCCINATE ER 100 MG PO TB24: ORAL_TABLET | ORAL | 1 refills | Status: DC

## 2021-11-24 NOTE — Telephone Encounter (Signed)
Refill failed. resent °

## 2021-11-24 NOTE — Addendum Note (Signed)
Addended by: Antonietta Barcelona D on: 11/24/2021 09:21 AM   Modules accepted: Orders

## 2022-01-19 ENCOUNTER — Other Ambulatory Visit: Payer: Self-pay | Admitting: Nurse Practitioner

## 2022-01-19 DIAGNOSIS — Z1231 Encounter for screening mammogram for malignant neoplasm of breast: Secondary | ICD-10-CM

## 2022-01-20 ENCOUNTER — Other Ambulatory Visit: Payer: Self-pay | Admitting: Nurse Practitioner

## 2022-01-20 DIAGNOSIS — I10 Essential (primary) hypertension: Secondary | ICD-10-CM

## 2022-01-23 ENCOUNTER — Ambulatory Visit
Admission: RE | Admit: 2022-01-23 | Discharge: 2022-01-23 | Disposition: A | Discharge status: Home / Self Care | Payer: BC Managed Care – PPO | Source: Ambulatory Visit | Attending: Nurse Practitioner | Admitting: Nurse Practitioner

## 2022-01-23 DIAGNOSIS — Z1231 Encounter for screening mammogram for malignant neoplasm of breast: Secondary | ICD-10-CM

## 2022-03-28 ENCOUNTER — Ambulatory Visit: Payer: BC Managed Care – PPO | Admitting: Nurse Practitioner

## 2022-03-28 ENCOUNTER — Encounter: Payer: Self-pay | Admitting: Nurse Practitioner

## 2022-03-28 VITALS — BP 140/82 | HR 70 | Temp 98.0°F | Resp 20 | Ht 65.0 in | Wt 234.0 lb

## 2022-03-28 DIAGNOSIS — I10 Essential (primary) hypertension: Secondary | ICD-10-CM | POA: Diagnosis not present

## 2022-03-28 DIAGNOSIS — F341 Dysthymic disorder: Secondary | ICD-10-CM

## 2022-03-28 DIAGNOSIS — E782 Mixed hyperlipidemia: Secondary | ICD-10-CM | POA: Diagnosis not present

## 2022-03-28 DIAGNOSIS — Z23 Encounter for immunization: Secondary | ICD-10-CM

## 2022-03-28 DIAGNOSIS — K219 Gastro-esophageal reflux disease without esophagitis: Secondary | ICD-10-CM | POA: Diagnosis not present

## 2022-03-28 DIAGNOSIS — R609 Edema, unspecified: Secondary | ICD-10-CM

## 2022-03-28 DIAGNOSIS — Z6841 Body Mass Index (BMI) 40.0 and over, adult: Secondary | ICD-10-CM

## 2022-03-28 DIAGNOSIS — R000 Tachycardia, unspecified: Secondary | ICD-10-CM

## 2022-03-28 MED ORDER — ATORVASTATIN CALCIUM 40 MG PO TABS: 40.0000 mg | ORAL_TABLET | Freq: Every day | ORAL | 1 refills | Status: DC

## 2022-03-28 MED ORDER — FUROSEMIDE 20 MG PO TABS: 20.0000 mg | ORAL_TABLET | Freq: Two times a day (BID) | ORAL | 1 refills | Status: DC

## 2022-03-28 MED ORDER — METOPROLOL SUCCINATE ER 100 MG PO TB24: ORAL_TABLET | ORAL | 1 refills | Status: DC

## 2022-03-28 MED ORDER — DULOXETINE HCL 60 MG PO CPEP: ORAL_CAPSULE | ORAL | 1 refills | Status: DC

## 2022-03-28 MED ORDER — AMLODIPINE BESYLATE 5 MG PO TABS: 5.0000 mg | ORAL_TABLET | Freq: Every day | ORAL | 1 refills | Status: DC

## 2022-03-28 MED ORDER — LISINOPRIL 20 MG PO TABS: 20.0000 mg | ORAL_TABLET | Freq: Every day | ORAL | 1 refills | Status: DC

## 2022-03-28 NOTE — Progress Notes (Signed)
Subjective:    Patient ID: Amanda Kelly, female    DOB: Jul 11, 1960, 62 y.o.   MRN: [id number]   Chief Complaint: medical management of chronic issues     HPI:  Amanda Kelly is a 62 y.o. who identifies as a female who was assigned female at birth.   Social history: Lives with: husband and children    Comes in today for follow up of the following chronic medical issues:  1. Primary hypertension No c/o chest pain, sob or headache. Doe snot check blood pressure at home. BP Readings from Last 3 Encounters:  09/23/21 130/75  03/17/21 113/74  03/15/21 132/78     2. Mixed hyperlipidemia Does not watch diet and does no dedicated exercise. Lab Results  Component Value Date   CHOL 165 09/23/2021   HDL 74 09/23/2021   LDLCALC 71 09/23/2021   TRIG 112 09/23/2021   CHOLHDL 2.2 09/23/2021     3. ANXIETY DEPRESSION Is on cymbalta and is wokrkng well for her    03/28/2022    3:37 PM 09/23/2021    2:52 PM 03/15/2021    3:45 PM 09/13/2020    4:05 PM  GAD 7 : Generalized Anxiety Score  Nervous, Anxious, on Edge 0 0 0 0  Control/stop worrying 0 0 0 0  Worry too much - different things 0 0 0 0  Trouble relaxing 0 0 0 0  Restless 0 0 0 0  Easily annoyed or irritable 0 1 0 0  Afraid - awful might happen 0 0 0 0  Total GAD 7 Score 0 1 0 0  Anxiety Difficulty Not difficult at all Not difficult at all Not difficult at all Not difficult at all      4. GERD without esophagitis Is on omeprazole daily and is doing well.  5. Peripheral edema Is on lasix and is working well.  6. BMI 40.0-44.9, adult (HCC) No recent weight changes. Wt Readings from Last 3 Encounters:  03/28/22 234 lb (106.1 kg)  09/23/21 236 lb (107 kg)  03/17/21 236 lb 15.9 oz (107.5 kg)   BMI Readings from Last 3 Encounters:  03/28/22 38.94 kg/m  09/23/21 39.27 kg/m  03/17/21 39.44 kg/m     New complaints: none  No Known Allergies Outpatient Encounter Medications as of 03/28/2022  Medication  Sig   amLODipine (NORVASC) 5 MG tablet Take 1 tablet (5 mg total) by mouth daily.   atorvastatin (LIPITOR) 40 MG tablet Take 1 tablet (40 mg total) by mouth daily.   DULoxetine (CYMBALTA) 60 MG capsule TAKE 1 CAPSULE BY MOUTH EVERY DAY   furosemide (LASIX) 20 MG tablet Take 1 tablet (20 mg total) by mouth 2 (two) times daily.   lisinopril (ZESTRIL) 20 MG tablet TAKE 1 TABLET BY MOUTH EVERY DAY   metoprolol succinate (TOPROL-XL) 100 MG 24 hr tablet Take with or immediately following a meal.   omeprazole (PRILOSEC) 20 MG capsule Take 1 capsule (20 mg total) by mouth daily.   No facility-administered encounter medications on file as of 03/28/2022.    Past Surgical History:  Procedure Laterality Date   BREAST BIOPSY Right    BREAST EXCISIONAL BIOPSY Left    BREAST EXCISIONAL BIOPSY Left    BREAST LUMPECTOMY WITH RADIOACTIVE SEED LOCALIZATION Left 07/07/2014   Procedure: LEFT BREAST LUMPECTOMY WITH RADIOACTIVE SEED LOCALIZATION;  Surgeon: Erroll Luna, MD;  Location: [facility];  Service: General;  Laterality: Left;   BREAST MASS EXCISION Left 2009  left-papaloma   COLONOSCOPY     ENDOMETRIAL ABLATION  2010   RE-EXCISION OF BREAST LUMPECTOMY Left 07/22/2014   Procedure:  LEFT BREAST REEXCISION LUMPECTOMY;  Surgeon: Erroll Luna, MD;  Location: [facility];  Service: General;  Laterality: Left;    Family History  Problem Relation Age of Onset   Cancer Father    Lung cancer Father        dx. 10s; smoker; no contact w/ father   Breast cancer Sister        dx. 51-53; metastatic   Memory loss Sister        short-term   Breast cancer Maternal Aunt 51   Kidney cancer Maternal Uncle        unknown age dx   Colon cancer Maternal Uncle        unknown age dx.   Stroke Maternal Uncle    Cancer Paternal Uncle        kidney cancer    Cancer Paternal Uncle        colon cancer   Breast cancer Maternal Grandmother 52   Breast cancer Cousin 87       bilateral w/  metastasis   Breast cancer Cousin        unknown age of dx   Breast cancer Cousin 24       mastectomy   Breast cancer Cousin 77       mastectomy   BRCA 1/2 Cousin        testing in 2015 in [place]   Breast cancer Other       Controlled substance contract: n/a     Review of Systems  Constitutional:  Negative for diaphoresis.  Eyes:  Negative for pain.  Respiratory:  Negative for shortness of breath.   Cardiovascular:  Negative for chest pain, palpitations and leg swelling.  Gastrointestinal:  Negative for abdominal pain.  Endocrine: Negative for polydipsia.  Skin:  Negative for rash.  Neurological:  Negative for dizziness, weakness and headaches.  Hematological:  Does not bruise/bleed easily.  All other systems reviewed and are negative.      Objective:   Physical Exam Vitals and nursing note reviewed.  Constitutional:      General: She is not in acute distress.    Appearance: Normal appearance. She is well-developed.  HENT:     Head: Normocephalic.     Right Ear: Tympanic membrane normal.     Left Ear: Tympanic membrane normal.     Nose: Nose normal.     Mouth/Throat:     Mouth: Mucous membranes are moist.  Eyes:     Pupils: Pupils are equal, round, and reactive to light.  Neck:     Vascular: No carotid bruit or JVD.  Cardiovascular:     Rate and Rhythm: Normal rate and regular rhythm.     Heart sounds: Normal heart sounds.  Pulmonary:     Effort: Pulmonary effort is normal. No respiratory distress.     Breath sounds: Normal breath sounds. No wheezing or rales.  Chest:     Chest wall: No tenderness.  Abdominal:     General: Bowel sounds are normal. There is no distension or abdominal bruit.     Palpations: Abdomen is soft. There is no hepatomegaly, splenomegaly, mass or pulsatile mass.     Tenderness: There is no abdominal tenderness.  Musculoskeletal:        General: Normal range of motion.     Cervical back: Normal range of  motion and neck supple.   Lymphadenopathy:     Cervical: No cervical adenopathy.  Skin:    General: Skin is warm and dry.  Neurological:     Mental Status: She is alert and oriented to person, place, and time.     Deep Tendon Reflexes: Reflexes are normal and symmetric.  Psychiatric:        Behavior: Behavior normal.        Thought Content: Thought content normal.        Judgment: Judgment normal.    BP (!) 140/82   Pulse 70   Temp 98 F (36.7 C) (Temporal)   Resp 20   Ht _0  (1.651 m)   Wt 234 lb (106.1 kg)   SpO2 99%   BMI 38.94 kg/m         Assessment & Plan:   Amanda Kelly comes in today with chief complaint of Medical Management of Chronic Issues   Diagnosis and orders addressed:  1. Primary hypertension Low sodium diet - lisinopril (ZESTRIL) 20 MG tablet; Take 1 tablet (20 mg total) by mouth daily.  Dispense: 90 tablet; Refill: 1 - amLODipine (NORVASC) 5 MG tablet; Take 1 tablet (5 mg total) by mouth daily.  Dispense: 90 tablet; Refill: 1 - CBC with Differential/Platelet - CMP14+EGFR  2. Mixed hyperlipidemia Low fta diet - atorvastatin (LIPITOR) 40 MG tablet; Take 1 tablet (40 mg total) by mouth daily.  Dispense: 90 tablet; Refill: 1 - Lipid panel  3. ANXIETY DEPRESSION Stress management - DULoxetine (CYMBALTA) 60 MG capsule; TAKE 1 CAPSULE BY MOUTH EVERY DAY  Dispense: 90 capsule; Refill: 1  4. GERD without esophagitis Avoid spicy foods Do not eat 2 hours prior to bedtime  5. Peripheral edema Elevate legs when sitting - furosemide (LASIX) 20 MG tablet; Take 1 tablet (20 mg total) by mouth 2 (two) times daily.  Dispense: 180 tablet; Refill: 1  6. BMI 40.0-44.9, adult ([place]) Discussed diet and exercise for person with BMI >25 Will recheck weight in 3-6 months   7. Tachycardia - metoprolol succinate (TOPROL-XL) 100 MG 24 hr tablet; Take with or immediately following a meal.  Dispense: 90 tablet; Refill: 1   Labs pending Health Maintenance reviewed Diet and  exercise encouraged  Follow up plan: 6 months   Mary-Margaret Hassell Done, FNP

## 2022-03-28 NOTE — Patient Instructions (Signed)

## 2022-03-29 ENCOUNTER — Other Ambulatory Visit: Payer: Self-pay | Admitting: Nurse Practitioner

## 2022-03-29 DIAGNOSIS — R609 Edema, unspecified: Secondary | ICD-10-CM

## 2022-03-29 LAB — CBC WITH DIFFERENTIAL/PLATELET
Basophils Absolute: 0 10*3/uL (ref 0.0–0.2)
Basos: 1 %
EOS (ABSOLUTE): 0.1 10*3/uL (ref 0.0–0.4)
Eos: 1 %
Hematocrit: 34 % (ref 34.0–46.6)
Hemoglobin: 12.2 g/dL (ref 11.1–15.9)
Immature Grans (Abs): 0 10*3/uL (ref 0.0–0.1)
Immature Granulocytes: 0 %
Lymphocytes Absolute: 1.8 10*3/uL (ref 0.7–3.1)
Lymphs: 23 %
MCH: 34.4 pg — ABNORMAL HIGH (ref 26.6–33.0)
MCHC: 35.9 g/dL — ABNORMAL HIGH (ref 31.5–35.7)
MCV: 96 fL (ref 79–97)
Monocytes Absolute: 0.5 10*3/uL (ref 0.1–0.9)
Monocytes: 7 %
Neutrophils Absolute: 5.2 10*3/uL (ref 1.4–7.0)
Neutrophils: 68 %
Platelets: 314 10*3/uL (ref 150–450)
RBC: 3.55 x10E6/uL — ABNORMAL LOW (ref 3.77–5.28)
RDW: 10.5 % — ABNORMAL LOW (ref 11.7–15.4)
WBC: 7.6 10*3/uL (ref 3.4–10.8)

## 2022-03-29 LAB — CMP14+EGFR
ALT: 25 IU/L (ref 0–32)
AST: 28 IU/L (ref 0–40)
Albumin/Globulin Ratio: 1.8 (ref 1.2–2.2)
Albumin: 4.7 g/dL (ref 3.8–4.9)
Alkaline Phosphatase: 89 IU/L (ref 44–121)
BUN/Creatinine Ratio: 25 — ABNORMAL HIGH (ref 9–23)
BUN: 14 mg/dL (ref 6–24)
Bilirubin Total: 0.5 mg/dL (ref 0.0–1.2)
CO2: 19 mmol/L — ABNORMAL LOW (ref 20–29)
Calcium: 9.8 mg/dL (ref 8.7–10.2)
Chloride: 93 mmol/L — ABNORMAL LOW (ref 96–106)
Creatinine, Ser: 0.57 mg/dL (ref 0.57–1.00)
Globulin, Total: 2.6 g/dL (ref 1.5–4.5)
Glucose: 91 mg/dL (ref 70–99)
Potassium: 4.1 mmol/L (ref 3.5–5.2)
Sodium: 131 mmol/L — ABNORMAL LOW (ref 134–144)
Total Protein: 7.3 g/dL (ref 6.0–8.5)
eGFR: 105 mL/min/{1.73_m2} (ref >59)

## 2022-03-29 LAB — LIPID PANEL
Chol/HDL Ratio: 2.2 ratio (ref 0.0–4.4)
Cholesterol, Total: 174 mg/dL (ref 100–199)
HDL: 80 mg/dL (ref >39)
LDL Chol Calc (NIH): 73 mg/dL (ref 0–99)
Triglycerides: 122 mg/dL (ref 0–149)
VLDL Cholesterol Cal: 21 mg/dL (ref 5–40)

## 2022-05-17 ENCOUNTER — Other Ambulatory Visit: Payer: Self-pay | Admitting: Nurse Practitioner

## 2022-05-17 DIAGNOSIS — F341 Dysthymic disorder: Secondary | ICD-10-CM

## 2022-05-17 DIAGNOSIS — E782 Mixed hyperlipidemia: Secondary | ICD-10-CM

## 2022-05-17 DIAGNOSIS — I10 Essential (primary) hypertension: Secondary | ICD-10-CM

## 2022-05-23 ENCOUNTER — Ambulatory Visit: Payer: BC Managed Care – PPO | Admitting: Nurse Practitioner

## 2022-05-23 ENCOUNTER — Encounter: Payer: Self-pay | Admitting: Nurse Practitioner

## 2022-05-23 VITALS — BP 141/82 | HR 105 | Temp 97.4°F | Resp 20 | Ht 65.0 in | Wt 222.0 lb

## 2022-05-23 DIAGNOSIS — K591 Functional diarrhea: Secondary | ICD-10-CM

## 2022-05-23 NOTE — Progress Notes (Signed)
   Subjective:    Patient ID: Amanda Kelly, female    DOB: [birth year], 62 y.o.   MRN: [id number]   Chief Complaint: Diarrhea   Diarrhea  This is a new problem. The current episode started more than 1 month ago. The problem occurs 5 to 10 times per day. The problem has been waxing and waning. The stool consistency is described as Watery. The patient states that diarrhea awakens her from sleep. Associated symptoms include headaches. Pertinent negatives include no abdominal pain, chills or fever. Nothing aggravates the symptoms. There are no known risk factors. Treatments tried: immodium initially.    Patient Active Problem List   Diagnosis Date Noted   Peripheral edema 09/23/2021   BMI 40.0-44.9, adult ([place]) 04/21/2016   GERD without esophagitis 04/21/2016   Genetic testing 02/22/2015   Family history of breast cancer in female 01/26/2015   Hypertension 03/05/2013   Hyperlipidemia 03/05/2013   Hx of smoking 07/07/2008   ANXIETY DEPRESSION 07/03/2008       Review of Systems  Constitutional:  Negative for chills and fever.  Gastrointestinal:  Positive for diarrhea. Negative for abdominal pain and blood in stool.  Neurological:  Positive for headaches.  Psychiatric/Behavioral:  The patient is nervous/anxious.        Objective:   Physical Exam Constitutional:      Appearance: Normal appearance.  Cardiovascular:     Rate and Rhythm: Normal rate and regular rhythm.     Heart sounds: Normal heart sounds.  Pulmonary:     Effort: Pulmonary effort is normal.     Breath sounds: Normal breath sounds.  Skin:    General: Skin is warm.  Neurological:     General: No focal deficit present.     Mental Status: She is alert and oriented to person, place, and time.  Psychiatric:        Mood and Affect: Mood normal.        Behavior: Behavior normal.    BP (!) 141/82   Pulse (!) 105   Temp (!) 97.4 F (36.3 C) (Temporal)   Resp 20   Ht 5' 5"$  (1.651 m)   Wt 222 lb (100.7 kg)    SpO2 98%   BMI 36.94 kg/m         Assessment & Plan:  Amanda Kelly in today with chief complaint of Diarrhea   1. Functional diarrhea GI stool specimen Force fluids Imodium OTC Referral to GI    The above assessment and management plan was discussed with the patient. The patient verbalized understanding of and has agreed to the management plan. Patient is aware to call the clinic if symptoms persist or worsen. Patient is aware when to return to the clinic for a follow-up visit. Patient educated on when it is appropriate to go to the emergency department.   Mary-Margaret Hassell Done, FNP

## 2022-05-23 NOTE — Patient Instructions (Signed)
Diarrhea, Adult Diarrhea is frequent loose and sometimes watery bowel movements. Diarrhea can make you feel weak and cause you to become dehydrated. Dehydration is a condition in which there is not enough water or other fluids in the body. Dehydration can make you tired and thirsty, cause you to have a dry mouth, and decrease how often you urinate. Diarrhea typically lasts 2-3 days. However, it can last longer if it is a sign of something more serious. It is important to treat your diarrhea as told by your health care provider. Follow these instructions at home: Eating and drinking     Follow these recommendations as told by your health care provider: Take an oral rehydration solution (ORS). This is an over-the-counter medicine that helps return your body to its normal balance of nutrients and water. It is found at pharmacies and retail stores. Drink enough fluid to keep your urine pale yellow. Drink fluids such as water, diluted fruit juice, and low-calorie sports drinks. You can drink milk also, if desired. Sucking on ice chips is another way to get fluids. Avoid drinking fluids that contain a lot of sugar or caffeine, such as soda, energy drinks, and regular sports drinks. Avoid alcohol. Eat bland, easy-to-digest foods in small amounts as you are able. These foods include bananas, applesauce, rice, lean meats, toast, and crackers. Avoid spicy or fatty foods.  Medicines Take over-the-counter and prescription medicines only as told by your health care provider. If you were prescribed antibiotics, take them as told by your health care provider. Do not stop using the antibiotic even if you start to feel better. General instructions  Wash your hands often using soap and water for at least 20 seconds. If soap and water are not available, use hand sanitizer. Others in the household should wash their hands as well. Hands should be washed: After using the toilet or changing a diaper. Before  preparing, cooking, or serving food. While caring for a sick person or while visiting someone in a hospital. Rest at home while you recover. Take a warm bath to relieve any burning or pain from frequent diarrhea episodes. Watch your condition for any changes. Contact a health care provider if: You have a fever. Your diarrhea gets worse. You have new symptoms. You vomit every time you eat or drink. You feel light-headed, dizzy, or have a headache. You have muscle cramps. You have signs of dehydration, such as: Dark urine, very little urine, or no urine. Cracked lips. Dry mouth. Sunken eyes. Sleepiness. Weakness. You have bloody or black stools or stools that look like tar. You have severe pain, cramping, or bloating in your abdomen. Your skin feels cold and clammy. You feel confused. Get help right away if: You have chest pain or your heart is beating very quickly. You have trouble breathing or you are breathing very quickly. You feel extremely weak or you faint. These symptoms may be an emergency. Get help right away. Call 911. Do not wait to see if the symptoms will go away. Do not drive yourself to the hospital. This information is not intended to replace advice given to you by your health care provider. Make sure you discuss any questions you have with your health care provider. Document Revised: 08/30/2021 Document Reviewed: 08/30/2021 Elsevier Patient Education  2023 [employer]  

## 2022-05-24 ENCOUNTER — Other Ambulatory Visit: Payer: BC Managed Care – PPO

## 2022-05-24 DIAGNOSIS — K591 Functional diarrhea: Secondary | ICD-10-CM | POA: Diagnosis not present

## 2022-05-30 LAB — CDIFF NAA+O+P+STOOL CULTURE
E coli, Shiga toxin Assay: NEGATIVE
Toxigenic C. Difficile by PCR: NEGATIVE

## 2022-05-30 MED ORDER — AZITHROMYCIN 250 MG PO TABS: ORAL_TABLET | ORAL | 0 refills | Status: AC

## 2022-05-31 ENCOUNTER — Other Ambulatory Visit: Payer: Self-pay | Admitting: Nurse Practitioner

## 2022-05-31 DIAGNOSIS — R000 Tachycardia, unspecified: Secondary | ICD-10-CM

## 2022-06-14 ENCOUNTER — Other Ambulatory Visit: Payer: Self-pay | Admitting: Nurse Practitioner

## 2022-06-14 DIAGNOSIS — I10 Essential (primary) hypertension: Secondary | ICD-10-CM

## 2022-06-14 DIAGNOSIS — R609 Edema, unspecified: Secondary | ICD-10-CM

## 2022-06-15 ENCOUNTER — Other Ambulatory Visit: Payer: Self-pay | Admitting: Nurse Practitioner

## 2022-06-15 DIAGNOSIS — R609 Edema, unspecified: Secondary | ICD-10-CM

## 2022-06-15 DIAGNOSIS — I10 Essential (primary) hypertension: Secondary | ICD-10-CM

## 2022-06-15 MED ORDER — LISINOPRIL 20 MG PO TABS: 20.0000 mg | ORAL_TABLET | Freq: Every day | ORAL | 1 refills | Status: DC

## 2022-06-15 MED ORDER — FUROSEMIDE 20 MG PO TABS: 20.0000 mg | ORAL_TABLET | Freq: Two times a day (BID) | ORAL | 1 refills | Status: DC

## 2022-07-04 ENCOUNTER — Other Ambulatory Visit: Payer: Self-pay | Admitting: Nurse Practitioner

## 2022-07-04 DIAGNOSIS — F341 Dysthymic disorder: Secondary | ICD-10-CM

## 2022-07-18 ENCOUNTER — Other Ambulatory Visit: Payer: Self-pay | Admitting: Nurse Practitioner

## 2022-07-18 DIAGNOSIS — I10 Essential (primary) hypertension: Secondary | ICD-10-CM

## 2022-07-18 DIAGNOSIS — E782 Mixed hyperlipidemia: Secondary | ICD-10-CM

## 2022-07-18 NOTE — Telephone Encounter (Signed)
Appt made fro 7/1, patient wanted the end of June or first of July

## 2022-07-18 NOTE — Telephone Encounter (Signed)
MMM NTBS in July for 6 mos FU refill not sent was sent in Jan

## 2022-07-19 ENCOUNTER — Other Ambulatory Visit: Payer: Self-pay | Admitting: Nurse Practitioner

## 2022-07-19 DIAGNOSIS — R000 Tachycardia, unspecified: Secondary | ICD-10-CM

## 2022-09-25 ENCOUNTER — Encounter: Payer: Self-pay | Admitting: Nurse Practitioner

## 2022-09-25 ENCOUNTER — Ambulatory Visit: Payer: BC Managed Care – PPO | Admitting: Nurse Practitioner

## 2022-09-25 VITALS — BP 119/69 | HR 96 | Temp 97.1°F | Resp 20 | Ht 65.0 in | Wt 224.0 lb

## 2022-09-25 DIAGNOSIS — F419 Anxiety disorder, unspecified: Secondary | ICD-10-CM

## 2022-09-25 DIAGNOSIS — F341 Dysthymic disorder: Secondary | ICD-10-CM

## 2022-09-25 DIAGNOSIS — F32A Depression, unspecified: Secondary | ICD-10-CM

## 2022-09-25 DIAGNOSIS — Z6841 Body Mass Index (BMI) 40.0 and over, adult: Secondary | ICD-10-CM

## 2022-09-25 DIAGNOSIS — K219 Gastro-esophageal reflux disease without esophagitis: Secondary | ICD-10-CM | POA: Diagnosis not present

## 2022-09-25 DIAGNOSIS — R000 Tachycardia, unspecified: Secondary | ICD-10-CM

## 2022-09-25 DIAGNOSIS — R600 Localized edema: Secondary | ICD-10-CM | POA: Diagnosis not present

## 2022-09-25 DIAGNOSIS — E782 Mixed hyperlipidemia: Secondary | ICD-10-CM

## 2022-09-25 DIAGNOSIS — I10 Essential (primary) hypertension: Secondary | ICD-10-CM

## 2022-09-25 DIAGNOSIS — Z87891 Personal history of nicotine dependence: Secondary | ICD-10-CM

## 2022-09-25 DIAGNOSIS — Z6837 Body mass index (BMI) 37.0-37.9, adult: Secondary | ICD-10-CM

## 2022-09-25 MED ORDER — DULOXETINE HCL 60 MG PO CPEP: ORAL_CAPSULE | ORAL | 1 refills | Status: DC

## 2022-09-25 MED ORDER — AMLODIPINE BESYLATE 5 MG PO TABS
5.0000 mg | ORAL_TABLET | Freq: Every day | ORAL | 1 refills | Status: DC
Start: 2022-09-25 — End: 2023-03-23

## 2022-09-25 MED ORDER — FUROSEMIDE 20 MG PO TABS
20.0000 mg | ORAL_TABLET | Freq: Two times a day (BID) | ORAL | 1 refills | Status: DC
Start: 2022-09-25 — End: 2023-04-03

## 2022-09-25 MED ORDER — METOPROLOL SUCCINATE ER 100 MG PO TB24: ORAL_TABLET | ORAL | 1 refills | Status: DC

## 2022-09-25 MED ORDER — LISINOPRIL 20 MG PO TABS: 20.0000 mg | ORAL_TABLET | Freq: Every day | ORAL | 1 refills | Status: DC

## 2022-09-25 MED ORDER — ATORVASTATIN CALCIUM 40 MG PO TABS: 40.0000 mg | ORAL_TABLET | Freq: Every day | ORAL | 1 refills | Status: DC

## 2022-09-25 NOTE — Addendum Note (Signed)
Addended by: Bennie Pierini on: 09/25/2022 04:38 PM   Modules accepted: Orders

## 2022-09-25 NOTE — Patient Instructions (Signed)
Peripheral Edema  Peripheral edema is swelling that is caused by a buildup of fluid. Peripheral edema most often affects the lower legs, ankles, and feet. It can also develop in the arms, hands, and face. The area of the body that has peripheral edema will look swollen. It may also feel heavy or warm. Your clothes may start to feel tight. Pressing on the area may make a temporary dent in your skin (pitting edema). You may not be able to move your swollen arm or leg as much as usual. There are many causes of peripheral edema. It can happen because of a complication of other conditions such as heart failure, kidney disease, or a problem with your circulation. It also can be a side effect of certain medicines or happen because of an infection. It often happens to women during pregnancy. Sometimes, the cause is not known. Follow these instructions at home: Managing pain, stiffness, and swelling  Raise (elevate) your legs while you are sitting or lying down. Move around often to prevent stiffness and to reduce swelling. Do not sit or stand for long periods of time. Do not wear tight clothing. Do not wear garters on your upper legs. Exercise your legs to get your circulation going. This helps to move the fluid back into your blood vessels, and it may help the swelling go down. Wear compression stockings as told by your health care provider. These stockings help to prevent blood clots and reduce swelling in your legs. It is important that these are the correct size. These stockings should be prescribed by your doctor to prevent possible injuries. If elastic bandages or wraps are recommended, use them as told by your health care provider. Medicines Take over-the-counter and prescription medicines only as told by your health care provider. Your health care provider may prescribe medicine to help your body get rid of excess water (diuretic). Take this medicine if you are told to take it. General  instructions Eat a low-salt (low-sodium) diet as told by your health care provider. Sometimes, eating less salt may reduce swelling. Pay attention to any changes in your symptoms. Moisturize your skin daily to help prevent skin from cracking and draining. Keep all follow-up visits. This is important. Contact a health care provider if: You have a fever. You have swelling in only one leg. You have increased swelling, redness, or pain in one or both of your legs. You have drainage or sores at the area where you have edema. Get help right away if: You have edema that starts suddenly or is getting worse, especially if you are pregnant or have a medical condition. You develop shortness of breath, especially when you are lying down. You have pain in your chest or abdomen. You feel weak. You feel like you will faint. These symptoms may be an emergency. Get help right away. Call 911. Do not wait to see if the symptoms will go away. Do not drive yourself to the hospital. Summary Peripheral edema is swelling that is caused by a buildup of fluid. Peripheral edema most often affects the lower legs, ankles, and feet. Move around often to prevent stiffness and to reduce swelling. Do not sit or stand for long periods of time. Pay attention to any changes in your symptoms. Contact a health care provider if you have edema that starts suddenly or is getting worse, especially if you are pregnant or have a medical condition. Get help right away if you develop shortness of breath, especially when lying down.   This information is not intended to replace advice given to you by your health care provider. Make sure you discuss any questions you have with your health care provider. Document Revised: 11/15/2020 Document Reviewed: 11/15/2020 Elsevier Patient Education  2024 [employer]  

## 2022-09-25 NOTE — Progress Notes (Signed)
Subjective:    Patient ID: Amanda Kelly, female    DOB: [birth year], 62 y.o.   MRN: [id number]   Chief Complaint: medical management of chronic issues     HPI:  Amanda Kelly is a 62 y.o. who identifies as a female who was assigned female at birth.   Social history: Lives with: husband and son  Comes in today for follow up of the following chronic medical issues:  1. Primary hypertension No c/o chest pain, sob or headache. Does not check blood pressure at home. BP Readings from Last 3 Encounters:  05/23/22 (!) 141/82  03/28/22 (!) 140/82  09/23/21 130/75     2. Mixed hyperlipidemia Does not watch diet,' Lab Results  Component Value Date   CHOL 174 03/28/2022   HDL 80 03/28/2022   LDLCALC 73 03/28/2022   TRIG 122 03/28/2022   CHOLHDL 2.2 03/28/2022     3. Peripheral edema Has edema by the end of each day  4. GERD without esophagitis Is on omeprazole daily and is doing well.  5. ANXIETY DEPRESSION Cymbalta helps. Has been under a lot of stress lately with family  issues. Is doing some better.    05/23/2022    2:57 PM 03/28/2022    3:37 PM 09/23/2021    2:52 PM 03/15/2021    3:45 PM  GAD 7 : Generalized Anxiety Score  Nervous, Anxious, on Edge 0 0 0 0  Control/stop worrying 0 0 0 0  Worry too much - different things 0 0 0 0  Trouble relaxing 0 0 0 0  Restless 0 0 0 0  Easily annoyed or irritable 0 0 1 0  Afraid - awful might happen 0 0 0 0  Total GAD 7 Score 0 0 1 0  Anxiety Difficulty Not difficult at all Not difficult at all Not difficult at all Not difficult at all       05/23/2022    2:57 PM 03/28/2022    3:36 PM 09/23/2021    2:52 PM  Depression screen PHQ 2/9  Decreased Interest 0 0 0  Down, Depressed, Hopeless 0 0 0  PHQ - 2 Score 0 0 0  Altered sleeping 0 0 0  Tired, decreased energy 1 0 1  Change in appetite 1 0 0  Feeling bad or failure about yourself  0 0 0  Trouble concentrating 0 0 0  Moving slowly or fidgety/restless 0 0 0   Suicidal thoughts 0 0 0  PHQ-9 Score 2 0 1  Difficult doing work/chores Not difficult at all Not difficult at all Not difficult at all     6. Hx of smoking Stop 15 years ago  7. BMI 40.0-44.9, adult (HCC) No recent weight changes. Wt Readings from Last 3 Encounters:  09/25/22 224 lb (101.6 kg)  05/23/22 222 lb (100.7 kg)  03/28/22 234 lb (106.1 kg)   BMI Readings from Last 3 Encounters:  09/25/22 37.28 kg/m  05/23/22 36.94 kg/m  03/28/22 38.94 kg/m     New complaints: None today  No Known Allergies Outpatient Encounter Medications as of 09/25/2022  Medication Sig   amLODipine (NORVASC) 5 MG tablet Take 1 tablet (5 mg total) by mouth daily.   atorvastatin (LIPITOR) 40 MG tablet Take 1 tablet (40 mg total) by mouth daily.   DULoxetine (CYMBALTA) 60 MG capsule Take 1 capsule by mouth daily.   furosemide (LASIX) 20 MG tablet Take 1 tablet (20 mg total) by mouth 2 (two) times daily.  lisinopril (ZESTRIL) 20 MG tablet Take 1 tablet (20 mg total) by mouth daily.   metoprolol succinate (TOPROL-XL) 100 MG 24 hr tablet Take 1 tablet by mouth with or immediately following a meal.   omeprazole (PRILOSEC) 20 MG capsule Take 1 capsule (20 mg total) by mouth daily.   No facility-administered encounter medications on file as of 09/25/2022.    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 3s; smoker; no contact w/ father   Breast cancer Sister        dx. 27-53; metastatic   Memory loss Sister        short-term   Breast cancer Maternal Aunt 60   Kidney cancer Maternal Uncle        unknown age dx   Colon cancer Maternal Uncle        unknown age dx.   Stroke Maternal Uncle    Cancer Paternal Uncle        kidney cancer    Cancer Paternal Uncle        colon cancer   Breast cancer Maternal Grandmother 60   Breast cancer Cousin 58       bilateral w/ metastasis   Breast cancer Cousin        unknown age of dx   Breast cancer Cousin 54       mastectomy   Breast cancer Cousin 57       mastectomy   BRCA 1/2 Cousin        testing in 2015 in [place]   Breast cancer Other       Controlled substance contract: n/a     Review of Systems  Constitutional:  Negative for diaphoresis.  Eyes:  Negative for pain.  Respiratory:  Negative for shortness of breath.   Cardiovascular:  Negative for chest pain, palpitations and leg swelling.  Gastrointestinal:  Negative for abdominal pain.  Endocrine: Negative for polydipsia.  Skin:  Negative for rash.  Neurological:  Negative for dizziness, weakness and headaches.  Hematological:  Does not bruise/bleed easily.  All other systems reviewed and are negative.      Objective:   Physical Exam Vitals and nursing note reviewed.  Constitutional:      General: She is not in acute distress.    Appearance: Normal appearance. She is well-developed.  HENT:     Head: Normocephalic.     Right Ear: Tympanic membrane normal.     Left Ear: Tympanic membrane normal.     Nose: Nose normal.     Mouth/Throat:     Mouth: Mucous membranes are moist.  Eyes:     Pupils: Pupils are equal, round, and reactive to light.  Neck:  Vascular: No carotid bruit or JVD.  Cardiovascular:     Rate and Rhythm:  Normal rate and regular rhythm.     Heart sounds: Normal heart sounds.  Pulmonary:     Effort: Pulmonary effort is normal. No respiratory distress.     Breath sounds: Normal breath sounds. No wheezing or rales.  Chest:     Chest wall: No tenderness.  Abdominal:     General: Bowel sounds are normal. There is no distension or abdominal bruit.     Palpations: Abdomen is soft. There is no hepatomegaly, splenomegaly, mass or pulsatile mass.     Tenderness: There is no abdominal tenderness.  Musculoskeletal:        General: Normal range of motion.     Cervical back: Normal range of motion and neck supple.  Lymphadenopathy:     Cervical: No cervical adenopathy.  Skin:    General: Skin is warm and dry.  Neurological:     Mental Status: She is alert and oriented to person, place, and time.     Deep Tendon Reflexes: Reflexes are normal and symmetric.  Psychiatric:        Behavior: Behavior normal.        Thought Content: Thought content normal.        Judgment: Judgment normal.    BP 119/69   Pulse 96   Temp (!) 97.1 F (36.2 C) (Temporal)   Resp 20   Ht 5\' 5"  (1.651 m)   Wt 224 lb (101.6 kg)   SpO2 99%   BMI 37.28 kg/m         Assessment & Plan:  CHARISE KOEGEL comes in today with chief complaint of Medical Management of Chronic Issues   Diagnosis and orders addressed:  1. Primary hypertension Low sodium diet - amLODipine (NORVASC) 5 MG tablet; Take 1 tablet (5 mg total) by mouth daily.  Dispense: 90 tablet; Refill: 1 - lisinopril (ZESTRIL) 20 MG tablet; Take 1 tablet (20 mg total) by mouth daily.  Dispense: 90 tablet; Refill: 1  2. Mixed hyperlipidemia Low fat diet - atorvastatin (LIPITOR) 40 MG tablet; Take 1 tablet (40 mg total) by mouth daily.  Dispense: 90 tablet; Refill: 1  3. Peripheral edema Elevate legs when sitting  - furosemide (LASIX) 20 MG tablet; Take 1 tablet (20 mg total) by mouth 2 (two) times daily.  Dispense: 180 tablet; Refill: 1  4. GERD  without esophagitis Avoid spicy foods Do not eat 2 hours prior to bedtime   5. ANXIETY DEPRESSION Stress management - DULoxetine (CYMBALTA) 60 MG capsule; Take 1 capsule by mouth daily.  Dispense: 90 capsule; Refill: 1  6. Hx of smoking Do not start smoking again  7. BMI 40.0-44.9, adult (HCC) Discussed diet and exercise for person with BMI >25 Will recheck weight in 3-6 months   8. Tachycardia Avoid caffeine - metoprolol succinate (TOPROL-XL) 100 MG 24 hr tablet; Take 1 tablet by mouth with or immediately following a meal.  Dispense: 90 tablet; Refill: 1   Labs pending Health Maintenance reviewed Diet and exercise encouraged  Follow up plan: 6 months   Mary-Margaret Daphine Deutscher, FNP

## 2022-09-26 LAB — CMP14+EGFR
ALT: 34 IU/L — ABNORMAL HIGH (ref 0–32)
AST: 29 IU/L (ref 0–40)
Albumin: 4.7 g/dL (ref 3.8–4.9)
Alkaline Phosphatase: 106 IU/L (ref 44–121)
BUN/Creatinine Ratio: 15 (ref 9–23)
BUN: 10 mg/dL (ref 6–24)
Bilirubin Total: 0.5 mg/dL (ref 0.0–1.2)
CO2: 22 mmol/L (ref 20–29)
Calcium: 10 mg/dL (ref 8.7–10.2)
Chloride: 92 mmol/L — ABNORMAL LOW (ref 96–106)
Creatinine, Ser: 0.65 mg/dL (ref 0.57–1.00)
Globulin, Total: 3 g/dL (ref 1.5–4.5)
Glucose: 84 mg/dL (ref 70–99)
Potassium: 4.2 mmol/L (ref 3.5–5.2)
Sodium: 131 mmol/L — ABNORMAL LOW (ref 134–144)
Total Protein: 7.7 g/dL (ref 6.0–8.5)
eGFR: 101 mL/min/{1.73_m2} (ref >59)

## 2022-09-26 LAB — CBC WITH DIFFERENTIAL/PLATELET
Basophils Absolute: 0.1 10*3/uL (ref 0.0–0.2)
Basos: 1 %
EOS (ABSOLUTE): 0.1 10*3/uL (ref 0.0–0.4)
Eos: 1 %
Hematocrit: 35.5 % (ref 34.0–46.6)
Hemoglobin: 12.5 g/dL (ref 11.1–15.9)
Immature Grans (Abs): 0 10*3/uL (ref 0.0–0.1)
Immature Granulocytes: 0 %
Lymphocytes Absolute: 2.6 10*3/uL (ref 0.7–3.1)
Lymphs: 29 %
MCH: 34.1 pg — ABNORMAL HIGH (ref 26.6–33.0)
MCHC: 35.2 g/dL (ref 31.5–35.7)
MCV: 97 fL (ref 79–97)
Monocytes Absolute: 0.6 10*3/uL (ref 0.1–0.9)
Monocytes: 7 %
Neutrophils Absolute: 5.5 10*3/uL (ref 1.4–7.0)
Neutrophils: 62 %
Platelets: 335 10*3/uL (ref 150–450)
RBC: 3.67 x10E6/uL — ABNORMAL LOW (ref 3.77–5.28)
RDW: 11.4 % — ABNORMAL LOW (ref 11.7–15.4)
WBC: 8.9 10*3/uL (ref 3.4–10.8)

## 2022-09-26 LAB — LIPID PANEL
Chol/HDL Ratio: 2.2 ratio (ref 0.0–4.4)
Cholesterol, Total: 178 mg/dL (ref 100–199)
HDL: 81 mg/dL (ref >39)
LDL Chol Calc (NIH): 67 mg/dL (ref 0–99)
Triglycerides: 186 mg/dL — ABNORMAL HIGH (ref 0–149)
VLDL Cholesterol Cal: 30 mg/dL (ref 5–40)

## 2023-01-23 ENCOUNTER — Other Ambulatory Visit: Payer: Self-pay | Admitting: Nurse Practitioner

## 2023-01-23 DIAGNOSIS — Z1231 Encounter for screening mammogram for malignant neoplasm of breast: Secondary | ICD-10-CM

## 2023-01-29 ENCOUNTER — Inpatient Hospital Stay: Admission: RE | Admit: 2023-01-29 | Payer: BC Managed Care – PPO | Source: Ambulatory Visit

## 2023-02-06 ENCOUNTER — Telehealth: Payer: Self-pay | Admitting: Nurse Practitioner

## 2023-02-06 ENCOUNTER — Other Ambulatory Visit: Payer: Self-pay | Admitting: Nurse Practitioner

## 2023-02-06 DIAGNOSIS — L989 Disorder of the skin and subcutaneous tissue, unspecified: Secondary | ICD-10-CM

## 2023-02-06 NOTE — Telephone Encounter (Signed)
Patient notified

## 2023-02-06 NOTE — Telephone Encounter (Signed)
Referral made to dermatology.

## 2023-02-14 ENCOUNTER — Ambulatory Visit
Admission: RE | Admit: 2023-02-14 | Discharge: 2023-02-14 | Disposition: A | Discharge status: Home / Self Care | Payer: BC Managed Care – PPO | Source: Ambulatory Visit | Attending: Nurse Practitioner | Admitting: Nurse Practitioner

## 2023-02-14 DIAGNOSIS — Z1231 Encounter for screening mammogram for malignant neoplasm of breast: Secondary | ICD-10-CM | POA: Diagnosis not present

## 2023-03-22 ENCOUNTER — Other Ambulatory Visit: Payer: Self-pay | Admitting: Nurse Practitioner

## 2023-03-22 DIAGNOSIS — E782 Mixed hyperlipidemia: Secondary | ICD-10-CM

## 2023-03-22 DIAGNOSIS — I10 Essential (primary) hypertension: Secondary | ICD-10-CM

## 2023-04-03 ENCOUNTER — Encounter: Payer: Self-pay | Admitting: Nurse Practitioner

## 2023-04-03 ENCOUNTER — Ambulatory Visit: Payer: BC Managed Care – PPO | Admitting: Nurse Practitioner

## 2023-04-03 VITALS — BP 129/87 | HR 88 | Temp 97.5°F | Ht 65.0 in | Wt 227.0 lb

## 2023-04-03 DIAGNOSIS — Z23 Encounter for immunization: Secondary | ICD-10-CM | POA: Diagnosis not present

## 2023-04-03 DIAGNOSIS — E782 Mixed hyperlipidemia: Secondary | ICD-10-CM

## 2023-04-03 DIAGNOSIS — F341 Dysthymic disorder: Secondary | ICD-10-CM

## 2023-04-03 DIAGNOSIS — I10 Essential (primary) hypertension: Secondary | ICD-10-CM

## 2023-04-03 DIAGNOSIS — R600 Localized edema: Secondary | ICD-10-CM

## 2023-04-03 DIAGNOSIS — R000 Tachycardia, unspecified: Secondary | ICD-10-CM

## 2023-04-03 MED ORDER — DULOXETINE HCL 60 MG PO CPEP
ORAL_CAPSULE | ORAL | 1 refills | Status: DC
Start: 2023-04-03 — End: 2023-10-02

## 2023-04-03 MED ORDER — FUROSEMIDE 20 MG PO TABS
20.0000 mg | ORAL_TABLET | Freq: Two times a day (BID) | ORAL | 1 refills | Status: DC
Start: 2023-04-03 — End: 2023-10-02

## 2023-04-03 MED ORDER — AMLODIPINE BESYLATE 5 MG PO TABS
5.0000 mg | ORAL_TABLET | Freq: Every day | ORAL | 1 refills | Status: DC
Start: 2023-04-03 — End: 2023-10-02

## 2023-04-03 MED ORDER — LISINOPRIL 20 MG PO TABS
20.0000 mg | ORAL_TABLET | Freq: Every day | ORAL | 1 refills | Status: DC
Start: 2023-04-03 — End: 2023-10-02

## 2023-04-03 MED ORDER — ATORVASTATIN CALCIUM 40 MG PO TABS: 40.0000 mg | ORAL_TABLET | Freq: Every day | ORAL | 1 refills | Status: DC

## 2023-04-03 MED ORDER — METOPROLOL SUCCINATE ER 100 MG PO TB24: ORAL_TABLET | ORAL | 1 refills | Status: DC

## 2023-04-03 NOTE — Patient Instructions (Signed)

## 2023-04-03 NOTE — Progress Notes (Signed)
 Subjective:    Patient ID: Amanda Kelly, female    DOB: Oct 19, 1960, 63 y.o.   MRN: [id number]   Chief Complaint: medical management of chronic issues     HPI:  Amanda Kelly is a 63 y.o. who identifies as a female who was assigned female at birth.   Social history: Lives with: husband and son  Comes in today for follow up of the following chronic medical issues:  1. Primary hypertension No c/o chest pain, sob or headache. Does not check blood pressure at home. BP Readings from Last 3 Encounters:  09/25/22 119/69  05/23/22 (!) 141/82  03/28/22 (!) 140/82     2. Mixed hyperlipidemia Does not watch diet,' Lab Results  Component Value Date   CHOL 178 09/25/2022   HDL 81 09/25/2022   LDLCALC 67 09/25/2022   TRIG 186 (H) 09/25/2022   CHOLHDL 2.2 09/25/2022     3. Peripheral edema Has edema by the end of each day  4. GERD without esophagitis Is on omeprazole  daily and is doing well.  5. ANXIETY DEPRESSION Cymbalta  helps. Has been under a lot of stress lately with family  issues. Is doing some better.    04/03/2023    3:56 PM 05/23/2022    2:57 PM 03/28/2022    3:36 PM  Depression screen PHQ 2/9  Decreased Interest 1 0 0  Down, Depressed, Hopeless 1 0 0  PHQ - 2 Score 2 0 0  Altered sleeping 0 0 0  Tired, decreased energy 0 1 0  Change in appetite 0 1 0  Feeling bad or failure about yourself  0 0 0  Trouble concentrating 0 0 0  Moving slowly or fidgety/restless 0 0 0  Suicidal thoughts 0 0 0  PHQ-9 Score 2 2 0  Difficult doing work/chores Not difficult at all Not difficult at all Not difficult at all      04/03/2023    3:56 PM 05/23/2022    2:57 PM 03/28/2022    3:37 PM 09/23/2021    2:52 PM  GAD 7 : Generalized Anxiety Score  Nervous, Anxious, on Edge 0 0 0 0  Control/stop worrying 0 0 0 0  Worry too much - different things 0 0 0 0  Trouble relaxing 0 0 0 0  Restless 0 0 0 0  Easily annoyed or irritable 0 0 0 1  Afraid - awful might happen 0 0 0 0   Total GAD 7 Score 0 0 0 1  Anxiety Difficulty Not difficult at all Not difficult at all Not difficult at all Not difficult at all     6. Hx of smoking Stop 15 years ago  7. BMI 40.0-44.9, adult (HCC) Weight is up 3 lbs  Wt Readings from Last 3 Encounters:  04/03/23 227 lb (103 kg)  09/25/22 224 lb (101.6 kg)  05/23/22 222 lb (100.7 kg)   BMI Readings from Last 3 Encounters:  04/03/23 37.77 kg/m  09/25/22 37.28 kg/m  05/23/22 36.94 kg/m      New complaints: None today  No Known Allergies Outpatient Encounter Medications as of 04/03/2023  Medication Sig   amLODipine  (NORVASC ) 5 MG tablet Take 1 tablet by mouth daily.   atorvastatin  (LIPITOR) 40 MG tablet Take 1 tablet by mouth daily.   DULoxetine  (CYMBALTA ) 60 MG capsule Take 1 capsule by mouth daily.   furosemide  (LASIX ) 20 MG tablet Take 1 tablet (20 mg total) by mouth 2 (two) times daily.   lisinopril  (  ZESTRIL ) 20 MG tablet Take 1 tablet (20 mg total) by mouth daily.   metoprolol  succinate (TOPROL -XL) 100 MG 24 hr tablet Take 1 tablet by mouth with or immediately following a meal.   No facility-administered encounter medications on file as of 04/03/2023.    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 22s; smoker; no contact w/ father   Breast cancer Sister        dx. 44-53; metastatic   Memory loss Sister        short-term   Breast cancer Maternal Aunt 81   Kidney cancer Maternal Uncle        unknown age dx   Colon cancer Maternal Uncle        unknown age dx.   Stroke Maternal Uncle    Cancer Paternal Uncle        kidney cancer    Cancer Paternal Uncle        colon cancer   Breast cancer Maternal Grandmother 70   Breast cancer Cousin 60       bilateral w/ metastasis   Breast cancer Cousin        unknown age of dx   Breast cancer Cousin 78       mastectomy   Breast cancer Cousin 68       mastectomy   BRCA 1/2 Cousin        testing in 2015 in [place]   Breast cancer Other       Controlled substance contract: n/a     Review of Systems  Constitutional:  Negative for diaphoresis.  Eyes:  Negative for pain.  Respiratory:  Negative for shortness of breath.   Cardiovascular:  Negative for chest pain, palpitations and leg swelling.  Gastrointestinal:  Negative for abdominal pain.  Endocrine: Negative for polydipsia.  Skin:  Negative for rash.  Neurological:  Negative for dizziness, weakness and headaches.  Hematological:  Does not bruise/bleed easily.  All other systems reviewed and are negative.      Objective:   Physical Exam Vitals and nursing note reviewed.  Constitutional:      General: She is not in acute distress.    Appearance: Normal appearance. She is well-developed.  HENT:     Head: Normocephalic.     Right Ear: Tympanic membrane normal.     Left Ear: Tympanic membrane normal.     Nose: Nose normal.     Mouth/Throat:     Mouth: Mucous membranes are moist.  Eyes:     Pupils: Pupils are equal, round, and reactive to light.  Neck:     Vascular: No carotid bruit or JVD.  Cardiovascular:     Rate  and Rhythm: Normal rate and regular rhythm.     Heart sounds: Normal heart sounds.  Pulmonary:     Effort: Pulmonary effort is  normal. No respiratory distress.     Breath sounds: Normal breath sounds. No wheezing or rales.  Chest:     Chest wall: No tenderness.  Abdominal:     General: Bowel sounds are normal. There is no distension or abdominal bruit.     Palpations: Abdomen is soft. There is no hepatomegaly, splenomegaly, mass or pulsatile mass.     Tenderness: There is no abdominal tenderness.  Musculoskeletal:        General: Normal range of motion.     Cervical back: Normal range of motion and neck supple.  Lymphadenopathy:     Cervical: No cervical adenopathy.  Skin:    General: Skin is warm and dry.  Neurological:     Mental Status: She is alert and oriented to person, place, and time.     Deep Tendon Reflexes: Reflexes are normal and symmetric.  Psychiatric:        Behavior: Behavior normal.        Thought Content: Thought content normal.        Judgment: Judgment normal.    BP 129/87   Pulse 88   Temp (!) 97.5 F (36.4 C) (Temporal)   Ht 5' 5 (1.651 m)   Wt 227 lb (103 kg)   SpO2 96%   BMI 37.77 kg/m          Assessment & Plan:  NICLE CONNOLE comes in today with chief complaint of No chief complaint on file.   Diagnosis and orders addressed:  1. Primary hypertension Low sodium diet - amLODipine  (NORVASC ) 5 MG tablet; Take 1 tablet (5 mg total) by mouth daily.  Dispense: 90 tablet; Refill: 1 - lisinopril  (ZESTRIL ) 20 MG tablet; Take 1 tablet (20 mg total) by mouth daily.  Dispense: 90 tablet; Refill: 1  2. Mixed hyperlipidemia Low fat diet - atorvastatin  (LIPITOR) 40 MG tablet; Take 1 tablet (40 mg total) by mouth daily.  Dispense: 90 tablet; Refill: 1  3. Peripheral edema Elevate legs when sitting  - furosemide  (LASIX ) 20 MG tablet; Take 1 tablet (20 mg total) by mouth 2 (two) times daily.  Dispense: 180 tablet; Refill: 1  4. GERD without esophagitis Avoid spicy foods Do not eat 2 hours prior to bedtime   5. ANXIETY DEPRESSION Stress management - DULoxetine   (CYMBALTA ) 60 MG capsule; Take 1 capsule by mouth daily.  Dispense: 90 capsule; Refill: 1  6. Hx of smoking Do not start smoking again  7. BMI 40.0-44.9, adult (HCC) Discussed diet and exercise for person with BMI >25 Will recheck weight in 3-6 months   8. Tachycardia Avoid caffeine - metoprolol  succinate (TOPROL -XL) 100 MG 24 hr tablet; Take 1 tablet by mouth with or immediately following a meal.  Dispense: 90 tablet; Refill: 1   Labs pending Health Maintenance reviewed Diet and exercise encouraged  Follow up plan: 6 months   Mary-Margaret Gladis, FNP

## 2023-10-02 ENCOUNTER — Encounter: Payer: Self-pay | Admitting: Nurse Practitioner

## 2023-10-02 ENCOUNTER — Ambulatory Visit: Payer: BC Managed Care – PPO | Admitting: Nurse Practitioner

## 2023-10-02 VITALS — BP 127/76 | HR 93 | Temp 98.4°F | Ht 65.0 in | Wt 224.0 lb

## 2023-10-02 DIAGNOSIS — I10 Essential (primary) hypertension: Secondary | ICD-10-CM | POA: Diagnosis not present

## 2023-10-02 DIAGNOSIS — F341 Dysthymic disorder: Secondary | ICD-10-CM

## 2023-10-02 DIAGNOSIS — K219 Gastro-esophageal reflux disease without esophagitis: Secondary | ICD-10-CM

## 2023-10-02 DIAGNOSIS — R000 Tachycardia, unspecified: Secondary | ICD-10-CM

## 2023-10-02 DIAGNOSIS — E782 Mixed hyperlipidemia: Secondary | ICD-10-CM | POA: Diagnosis not present

## 2023-10-02 DIAGNOSIS — Z6841 Body Mass Index (BMI) 40.0 and over, adult: Secondary | ICD-10-CM

## 2023-10-02 DIAGNOSIS — L989 Disorder of the skin and subcutaneous tissue, unspecified: Secondary | ICD-10-CM

## 2023-10-02 DIAGNOSIS — R600 Localized edema: Secondary | ICD-10-CM

## 2023-10-02 MED ORDER — ATORVASTATIN CALCIUM 40 MG PO TABS: 40.0000 mg | ORAL_TABLET | Freq: Every day | ORAL | 1 refills | Status: DC

## 2023-10-02 MED ORDER — BUPROPION HCL ER (XL) 150 MG PO TB24: 150.0000 mg | ORAL_TABLET | Freq: Every day | ORAL | 1 refills | Status: DC

## 2023-10-02 MED ORDER — LISINOPRIL 20 MG PO TABS: 20.0000 mg | ORAL_TABLET | Freq: Every day | ORAL | 1 refills | Status: DC

## 2023-10-02 MED ORDER — METOPROLOL SUCCINATE ER 100 MG PO TB24: ORAL_TABLET | ORAL | 1 refills | Status: DC

## 2023-10-02 MED ORDER — DULOXETINE HCL 60 MG PO CPEP: ORAL_CAPSULE | ORAL | 1 refills | Status: DC

## 2023-10-02 MED ORDER — FUROSEMIDE 20 MG PO TABS: 20.0000 mg | ORAL_TABLET | Freq: Two times a day (BID) | ORAL | 1 refills | Status: DC

## 2023-10-02 MED ORDER — AMLODIPINE BESYLATE 5 MG PO TABS: 5.0000 mg | ORAL_TABLET | Freq: Every day | ORAL | 1 refills | Status: DC

## 2023-10-02 NOTE — Patient Instructions (Signed)

## 2023-10-02 NOTE — Progress Notes (Signed)
 Subjective:    Patient ID: Amanda Kelly, female    DOB: 01-Apr-1960, 63 y.o.   MRN: [id number]   Chief Complaint: annual physical    HPI:  Amanda Kelly is a 63 y.o. who identifies as a female who was assigned female at birth.   Social history: Lives with: husband and son  Comes in today for follow up of the following chronic medical issues:  1. Primary hypertension No c/o chest pain, sob or headache. Does not check blood pressure at home. BP Readings from Last 3 Encounters:  04/03/23 129/87  09/25/22 119/69  05/23/22 (!) 141/82     2. Mixed hyperlipidemia Does not watch diet,' Lab Results  Component Value Date   CHOL 178 09/25/2022   HDL 81 09/25/2022   LDLCALC 67 09/25/2022   TRIG 186 (H) 09/25/2022   CHOLHDL 2.2 09/25/2022     3. Peripheral edema Has edema by the end of each day  4. GERD without esophagitis Is on omeprazole  daily and is doing well.  5. ANXIETY DEPRESSION Cymbalta  helps. Has been under a lot of stress lately with family  issues. Is doing some better.     10/02/2023    3:47 PM 04/03/2023    3:56 PM 05/23/2022    2:57 PM 03/28/2022    3:37 PM  GAD 7 : Generalized Anxiety Score  Nervous, Anxious, on Edge 0 0 0 0  Control/stop worrying 0 0 0 0  Worry too much - different things 0 0 0 0  Trouble relaxing 1 0 0 0  Restless 0 0 0 0  Easily annoyed or irritable 0 0 0 0  Afraid - awful might happen 0 0 0 0  Total GAD 7 Score 1 0 0 0  Anxiety Difficulty Not difficult at all Not difficult at all Not difficult at all Not difficult at all       10/02/2023    3:46 PM 04/03/2023    3:56 PM 05/23/2022    2:57 PM  Depression screen PHQ 2/9  Decreased Interest 2 1 0  Down, Depressed, Hopeless 0 1 0  PHQ - 2 Score 2 2 0  Altered sleeping 1 0 0  Tired, decreased energy 0 0 1  Change in appetite 0 0 1  Feeling bad or failure about yourself  1 0 0  Trouble concentrating 0 0 0  Moving slowly or fidgety/restless 0 0 0  Suicidal thoughts 0 0 0  PHQ-9  Score 4 2 2   Difficult doing work/chores Somewhat difficult Not difficult at all Not difficult at all    6. Hx of smoking Stop 15 years ago  7. BMI 40.0-44.9, adult (HCC) Weight is down 3 lbs   Wt Readings from Last 3 Encounters:  10/02/23 224 lb (101.6 kg)  04/03/23 227 lb (103 kg)  09/25/22 224 lb (101.6 kg)   BMI Readings from Last 3 Encounters:  10/02/23 37.28 kg/m  04/03/23 37.77 kg/m  09/25/22 37.28 kg/m      New complaints: None today  No Known Allergies Outpatient Encounter Medications as of 10/02/2023  Medication Sig   amLODipine  (NORVASC ) 5 MG tablet Take 1 tablet (5 mg total) by mouth daily.   atorvastatin  (LIPITOR) 40 MG tablet Take 1 tablet (40 mg total) by mouth daily.   DULoxetine  (CYMBALTA ) 60 MG capsule Take 1 capsule by mouth daily.   furosemide  (LASIX ) 20 MG tablet Take 1 tablet (20 mg total) by mouth 2 (two) times daily.  lisinopril  (ZESTRIL ) 20 MG tablet Take 1 tablet (20 mg total) by mouth daily.   metoprolol  succinate (TOPROL -XL) 100 MG 24 hr tablet Take 1 tablet by mouth with or immediately following a meal.   No facility-administered encounter medications on file as of 10/02/2023.    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 62s; smoker; no contact w/ father   Breast cancer Sister        dx. 68-53; metastatic   Memory loss Sister        short-term   Breast cancer Maternal Aunt 72   Kidney cancer Maternal Uncle        unknown age dx   Colon cancer Maternal Uncle        unknown age dx.   Stroke Maternal Uncle    Cancer Paternal Uncle        kidney cancer    Cancer Paternal Uncle        colon cancer   Breast cancer Maternal Grandmother 88   Breast cancer Cousin 15       bilateral w/ metastasis   Breast cancer Cousin        unknown age of dx   Breast cancer Cousin 63       mastectomy   Breast cancer Cousin 43       mastectomy   BRCA 1/2 Cousin        testing in 2015 in [place]   Breast cancer Other       Controlled substance contract: n/a     Review of Systems  Constitutional:  Negative for diaphoresis.  Eyes:  Negative for pain.  Respiratory:  Negative for shortness of breath.   Cardiovascular:  Negative for chest pain, palpitations and leg swelling.  Gastrointestinal:  Negative for abdominal pain.  Endocrine: Negative for polydipsia.  Skin:  Negative for rash.  Neurological:  Negative for dizziness, weakness and headaches.  Hematological:  Does not bruise/bleed easily.  All other systems reviewed and are negative.      Objective:   Physical Exam Vitals and nursing note reviewed.  Constitutional:      General: She is not in acute distress.    Appearance: Normal appearance. She is well-developed.  HENT:     Head: Normocephalic.     Right Ear: Tympanic membrane normal.     Left Ear: Tympanic membrane normal.     Nose: Nose normal.     Mouth/Throat:     Mouth: Mucous membranes are moist.  Eyes:     Pupils: Pupils are equal, round, and reactive to light.  Neck:     Vascular: No carotid bruit or JVD.  Cardiovascular:  Rate and Rhythm: Normal rate and regular rhythm.     Heart sounds: Normal heart sounds.  Pulmonary:     Effort: Pulmonary effort is normal. No  respiratory distress.     Breath sounds: Normal breath sounds. No wheezing or rales.  Chest:     Chest wall: No tenderness.  Abdominal:     General: Bowel sounds are normal. There is no distension or abdominal bruit.     Palpations: Abdomen is soft. There is no hepatomegaly, splenomegaly, mass or pulsatile mass.     Tenderness: There is no abdominal tenderness.  Musculoskeletal:        General: Normal range of motion.     Cervical back: Normal range of motion and neck supple.  Lymphadenopathy:     Cervical: No cervical adenopathy.  Skin:    General: Skin is warm and dry.  Neurological:     Mental Status: She is alert and oriented to person, place, and time.     Deep Tendon Reflexes: Reflexes are normal and symmetric.  Psychiatric:        Behavior: Behavior normal.        Thought Content: Thought content normal.        Judgment: Judgment normal.    BP 127/76   Pulse 93   Temp 98.4 F (36.9 C) (Temporal)   Ht 5' 5 (1.651 m)   Wt 224 lb (101.6 kg)   SpO2 95%   BMI 37.28 kg/m          Assessment & Plan:  Amanda Kelly comes in today with chief complaint of annual physical  Diagnosis and orders addressed:  1. Primary hypertension Low sodium diet - amLODipine  (NORVASC ) 5 MG tablet; Take 1 tablet (5 mg total) by mouth daily.  Dispense: 90 tablet; Refill: 1 - lisinopril  (ZESTRIL ) 20 MG tablet; Take 1 tablet (20 mg total) by mouth daily.  Dispense: 90 tablet; Refill: 1  2. Mixed hyperlipidemia Low fat diet - atorvastatin  (LIPITOR) 40 MG tablet; Take 1 tablet (40 mg total) by mouth daily.  Dispense: 90 tablet; Refill: 1  3. Peripheral edema Elevate legs when sitting  - furosemide  (LASIX ) 20 MG tablet; Take 1 tablet (20 mg total) by mouth 2 (two) times daily.  Dispense: 180 tablet; Refill: 1  4. GERD without esophagitis Avoid spicy foods Do not eat 2 hours prior to bedtime   5. ANXIETY DEPRESSION Stress management Added wellbutrin  - DULoxetine  (CYMBALTA ) 60 MG  capsule; Take 1 capsule by mouth daily.  Dispense: 90 capsule; Refill: 1 - added wellbutrin  10XL 1 po daily 6. Hx of smoking Do not start smoking again  7. BMI 40.0-44.9, adult (HCC) Discussed diet and exercise for person with BMI >25 Will recheck weight in 3-6 months   8. Tachycardia Avoid caffeine - metoprolol  succinate (TOPROL -XL) 100 MG 24 hr tablet; Take 1 tablet by mouth with or immediately following a meal.  Dispense: 90 tablet; Refill: 1   Labs pending Health Maintenance reviewed Diet and exercise encouraged  Follow up plan: 6 months   Mary-Margaret Gladis, FNP

## 2023-10-02 NOTE — Addendum Note (Signed)
 Addended by: Jayelle Page, MARY-MARGARET on: 10/02/2023 04:13 PM   Modules accepted: Orders

## 2023-10-03 LAB — LIPID PANEL
Chol/HDL Ratio: 2.3 ratio (ref 0.0–4.4)
Cholesterol, Total: 167 mg/dL (ref 100–199)
HDL: 73 mg/dL (ref >39)
LDL Chol Calc (NIH): 68 mg/dL (ref 0–99)
Triglycerides: 157 mg/dL — ABNORMAL HIGH (ref 0–149)
VLDL Cholesterol Cal: 26 mg/dL (ref 5–40)

## 2023-10-03 LAB — CMP14+EGFR
ALT: 31 IU/L (ref 0–32)
AST: 37 IU/L (ref 0–40)
Albumin: 4.7 g/dL (ref 3.8–4.9)
Alkaline Phosphatase: 98 IU/L (ref 44–121)
BUN/Creatinine Ratio: 19 (ref 12–28)
BUN: 12 mg/dL (ref 8–27)
Bilirubin Total: 0.4 mg/dL (ref 0.0–1.2)
CO2: 18 mmol/L — ABNORMAL LOW (ref 20–29)
Calcium: 9.8 mg/dL (ref 8.7–10.3)
Chloride: 93 mmol/L — ABNORMAL LOW (ref 96–106)
Creatinine, Ser: 0.62 mg/dL (ref 0.57–1.00)
Globulin, Total: 2.7 g/dL (ref 1.5–4.5)
Glucose: 91 mg/dL (ref 70–99)
Potassium: 4.5 mmol/L (ref 3.5–5.2)
Sodium: 130 mmol/L — ABNORMAL LOW (ref 134–144)
Total Protein: 7.4 g/dL (ref 6.0–8.5)
eGFR: 102 mL/min/1.73 (ref >59)

## 2023-10-03 LAB — CBC WITH DIFFERENTIAL/PLATELET
Basophils Absolute: 0.1 x10E3/uL (ref 0.0–0.2)
Basos: 1 %
EOS (ABSOLUTE): 0.1 x10E3/uL (ref 0.0–0.4)
Eos: 1 %
Hematocrit: 36.5 % (ref 34.0–46.6)
Hemoglobin: 12 g/dL (ref 11.1–15.9)
Immature Grans (Abs): 0 x10E3/uL (ref 0.0–0.1)
Immature Granulocytes: 0 %
Lymphocytes Absolute: 2.5 x10E3/uL (ref 0.7–3.1)
Lymphs: 31 %
MCH: 33.4 pg — ABNORMAL HIGH (ref 26.6–33.0)
MCHC: 32.9 g/dL (ref 31.5–35.7)
MCV: 102 fL — ABNORMAL HIGH (ref 79–97)
Monocytes Absolute: 0.6 x10E3/uL (ref 0.1–0.9)
Monocytes: 7 %
Neutrophils Absolute: 5 x10E3/uL (ref 1.4–7.0)
Neutrophils: 60 %
Platelets: 292 x10E3/uL (ref 150–450)
RBC: 3.59 x10E6/uL — ABNORMAL LOW (ref 3.77–5.28)
RDW: 11.8 % (ref 11.7–15.4)
WBC: 8.3 x10E3/uL (ref 3.4–10.8)

## 2023-10-04 ENCOUNTER — Ambulatory Visit: Payer: Self-pay | Admitting: Nurse Practitioner

## 2023-10-08 ENCOUNTER — Encounter: Payer: Self-pay | Admitting: Nurse Practitioner

## 2023-10-08 ENCOUNTER — Other Ambulatory Visit: Payer: Self-pay | Admitting: Nurse Practitioner

## 2023-10-08 MED ORDER — DULOXETINE HCL 60 MG PO CPEP: 120.0000 mg | ORAL_CAPSULE | Freq: Every day | ORAL | 11 refills | Status: DC

## 2023-10-08 MED ORDER — DULOXETINE HCL 60 MG PO CPEP: 60.0000 mg | ORAL_CAPSULE | Freq: Every day | ORAL | 2 refills | Status: DC

## 2023-10-08 NOTE — Progress Notes (Signed)
 Meds ordered this encounter  Medications   DULoxetine  (CYMBALTA ) 60 MG capsule    Sig: Take 1 capsule (60 mg total) by mouth daily.    Dispense:  60 capsule    Refill:  2    Supervising Provider:   MARYANNE CHEW A [[id number]]   Mary-Margaret Gladis, FNP

## 2023-11-20 ENCOUNTER — Other Ambulatory Visit: Payer: Self-pay | Admitting: *Deleted

## 2023-11-20 MED ORDER — DULOXETINE HCL 60 MG PO CPEP: 120.0000 mg | ORAL_CAPSULE | Freq: Every day | ORAL | 3 refills | Status: DC

## 2023-11-27 DIAGNOSIS — R519 Headache, unspecified: Secondary | ICD-10-CM | POA: Diagnosis not present

## 2023-11-27 DIAGNOSIS — M530 Cervicocranial syndrome: Secondary | ICD-10-CM | POA: Diagnosis not present

## 2023-11-27 DIAGNOSIS — M9901 Segmental and somatic dysfunction of cervical region: Secondary | ICD-10-CM | POA: Diagnosis not present

## 2023-11-27 DIAGNOSIS — M9902 Segmental and somatic dysfunction of thoracic region: Secondary | ICD-10-CM | POA: Diagnosis not present

## 2023-11-30 DIAGNOSIS — M9902 Segmental and somatic dysfunction of thoracic region: Secondary | ICD-10-CM | POA: Diagnosis not present

## 2023-11-30 DIAGNOSIS — M9901 Segmental and somatic dysfunction of cervical region: Secondary | ICD-10-CM | POA: Diagnosis not present

## 2023-12-04 DIAGNOSIS — M530 Cervicocranial syndrome: Secondary | ICD-10-CM | POA: Diagnosis not present

## 2023-12-04 DIAGNOSIS — R519 Headache, unspecified: Secondary | ICD-10-CM | POA: Diagnosis not present

## 2023-12-04 DIAGNOSIS — M9901 Segmental and somatic dysfunction of cervical region: Secondary | ICD-10-CM | POA: Diagnosis not present

## 2023-12-04 DIAGNOSIS — M9902 Segmental and somatic dysfunction of thoracic region: Secondary | ICD-10-CM | POA: Diagnosis not present

## 2023-12-07 DIAGNOSIS — M530 Cervicocranial syndrome: Secondary | ICD-10-CM | POA: Diagnosis not present

## 2023-12-07 DIAGNOSIS — M9902 Segmental and somatic dysfunction of thoracic region: Secondary | ICD-10-CM | POA: Diagnosis not present

## 2023-12-07 DIAGNOSIS — R519 Headache, unspecified: Secondary | ICD-10-CM | POA: Diagnosis not present

## 2023-12-10 ENCOUNTER — Other Ambulatory Visit: Payer: Self-pay | Admitting: Nurse Practitioner

## 2023-12-10 DIAGNOSIS — F341 Dysthymic disorder: Secondary | ICD-10-CM

## 2023-12-10 MED ORDER — DULOXETINE HCL 60 MG PO CPEP: 120.0000 mg | ORAL_CAPSULE | Freq: Every day | ORAL | 1 refills | Status: DC

## 2024-02-01 ENCOUNTER — Telehealth: Payer: Self-pay

## 2024-02-01 NOTE — Telephone Encounter (Signed)
 I call ed pt & made her an appt for Mammogram on 03-03-2024.

## 2024-02-01 NOTE — Telephone Encounter (Unsigned)
 Copied from CRM #[id number]. Topic: Clinical - Request for Lab/Test Order >> Feb 01, 2024  2:05 PM Roselie BROCKS wrote: Reason for CRM: Patient requests a mammogram to be scheduled on the bus,please return a call as soon as possible,

## 2024-02-29 ENCOUNTER — Other Ambulatory Visit: Payer: Self-pay | Admitting: Nurse Practitioner

## 2024-02-29 DIAGNOSIS — Z1231 Encounter for screening mammogram for malignant neoplasm of breast: Secondary | ICD-10-CM

## 2024-03-03 ENCOUNTER — Inpatient Hospital Stay
Admission: RE | Admit: 2024-03-03 | Discharge: 2024-03-03 | Disposition: A | Source: Ambulatory Visit | Attending: Nurse Practitioner | Admitting: Nurse Practitioner

## 2024-03-03 DIAGNOSIS — Z1231 Encounter for screening mammogram for malignant neoplasm of breast: Secondary | ICD-10-CM

## 2024-03-31 ENCOUNTER — Ambulatory Visit: Payer: Self-pay | Admitting: Nurse Practitioner

## 2024-04-03 ENCOUNTER — Encounter: Payer: Self-pay | Admitting: Nurse Practitioner

## 2024-04-10 ENCOUNTER — Encounter: Payer: Self-pay | Admitting: Nurse Practitioner

## 2024-04-10 ENCOUNTER — Ambulatory Visit: Admitting: Nurse Practitioner

## 2024-04-10 VITALS — BP 136/79 | HR 61 | Temp 97.0°F | Ht 65.0 in | Wt 219.0 lb

## 2024-04-10 DIAGNOSIS — K219 Gastro-esophageal reflux disease without esophagitis: Secondary | ICD-10-CM

## 2024-04-10 DIAGNOSIS — Z87891 Personal history of nicotine dependence: Secondary | ICD-10-CM | POA: Diagnosis not present

## 2024-04-10 DIAGNOSIS — E782 Mixed hyperlipidemia: Secondary | ICD-10-CM

## 2024-04-10 DIAGNOSIS — Z6841 Body Mass Index (BMI) 40.0 and over, adult: Secondary | ICD-10-CM

## 2024-04-10 DIAGNOSIS — I10 Essential (primary) hypertension: Secondary | ICD-10-CM | POA: Diagnosis not present

## 2024-04-10 DIAGNOSIS — R000 Tachycardia, unspecified: Secondary | ICD-10-CM

## 2024-04-10 DIAGNOSIS — Z23 Encounter for immunization: Secondary | ICD-10-CM

## 2024-04-10 DIAGNOSIS — R600 Localized edema: Secondary | ICD-10-CM | POA: Diagnosis not present

## 2024-04-10 DIAGNOSIS — F341 Dysthymic disorder: Secondary | ICD-10-CM

## 2024-04-10 MED ORDER — LISINOPRIL 20 MG PO TABS: 20.0000 mg | ORAL_TABLET | Freq: Every day | ORAL | 1 refills | Status: AC

## 2024-04-10 MED ORDER — ATORVASTATIN CALCIUM 40 MG PO TABS: 40.0000 mg | ORAL_TABLET | Freq: Every day | ORAL | 1 refills | Status: AC

## 2024-04-10 MED ORDER — DULOXETINE HCL 60 MG PO CPEP: 120.0000 mg | ORAL_CAPSULE | Freq: Every day | ORAL | 1 refills | Status: AC

## 2024-04-10 MED ORDER — METOPROLOL SUCCINATE ER 100 MG PO TB24: ORAL_TABLET | ORAL | 1 refills | Status: AC

## 2024-04-10 MED ORDER — FUROSEMIDE 20 MG PO TABS: 20.0000 mg | ORAL_TABLET | Freq: Two times a day (BID) | ORAL | 1 refills | Status: AC

## 2024-04-10 MED ORDER — AMLODIPINE BESYLATE 5 MG PO TABS: 5.0000 mg | ORAL_TABLET | Freq: Every day | ORAL | 1 refills | Status: AC

## 2024-04-10 NOTE — Progress Notes (Signed)
 "  Subjective:    Patient ID: Amanda Kelly, female    DOB: 1960/06/02, 64 y.o.   MRN: [id number]   Chief Complaint: medical management of chronic issues     HPI:  Amanda Kelly is a 64 y.o. who identifies as a female who was assigned female at birth.   Social history: Lives with: husband and son  Comes in today for follow up of the following chronic medical issues:  1. Primary hypertension No c/o chest pain, sob or headache. Does not check blood pressure at home. BP Readings from Last 3 Encounters:  10/02/23 127/76  04/03/23 129/87  09/25/22 119/69     2. Mixed hyperlipidemia Does not watch diet,' Lab Results  Component Value Date   CHOL 167 10/02/2023   HDL 73 10/02/2023   LDLCALC 68 10/02/2023   TRIG 157 (H) 10/02/2023   CHOLHDL 2.3 10/02/2023   The 10-year ASCVD risk score (Arnett DK, et al., 2019) is: 4.1%   3. Peripheral edema Has edema by the end of each day  4. GERD without esophagitis Is on omeprazole  daily and is doing well.  5. ANXIETY DEPRESSION Cymbalta  helps. Has been under a lot of stress lately with family  issues. Is doing some better.     04/10/2024    8:30 AM 10/02/2023    3:47 PM 04/03/2023    3:56 PM 05/23/2022    2:57 PM  GAD 7 : Generalized Anxiety Score  Nervous, Anxious, on Edge 0 0 0 0  Control/stop worrying 0 0 0 0  Worry too much - different things 0 0 0 0  Trouble relaxing 0 1 0 0  Restless 0 0 0 0  Easily annoyed or irritable 0 0 0 0  Afraid - awful might happen 0 0 0 0  Total GAD 7 Score 0 1 0 0  Anxiety Difficulty Not difficult at all Not difficult at all Not difficult at all Not difficult at all       6. Hx of smoking Stop 15 years ago  7. BMI 40.0-44.9, adult (HCC) Has lost 5lbs since last visit  Wt Readings from Last 3 Encounters:  04/10/24 219 lb (99.3 kg)  10/02/23 224 lb (101.6 kg)  04/03/23 227 lb (103 kg)   BMI Readings from Last 3 Encounters:  04/10/24 36.44 kg/m  10/02/23 37.28 kg/m  04/03/23  37.77 kg/m      New complaints: None today  Allergies  Allergen Reactions   Wellbutrin  [Bupropion ] Swelling    sweating   Outpatient Encounter Medications as of 04/10/2024  Medication Sig   amLODipine  (NORVASC ) 5 MG tablet Take 1 tablet (5 mg total) by mouth daily.   atorvastatin  (LIPITOR) 40 MG tablet Take 1 tablet (40 mg total) by mouth daily.   buPROPion  (WELLBUTRIN  XL) 150 MG 24 hr tablet Take 1 tablet (150 mg total) by mouth daily.   DULoxetine  (CYMBALTA ) 60 MG capsule Take 2 capsules (120 mg total) by mouth daily.   furosemide  (LASIX ) 20 MG tablet Take 1 tablet (20 mg total) by mouth 2 (two) times daily.   lisinopril  (ZESTRIL ) 20 MG tablet Take 1 tablet (20 mg total) by mouth daily.   metoprolol  succinate (TOPROL -XL) 100 MG 24 hr tablet Take 1 tablet by mouth with or immediately following a meal.   No facility-administered encounter medications on file as of 04/10/2024.    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 58s; smoker; no contact w/ father   Breast cancer Sister        dx. 2-53; metastatic   Memory loss Sister        short-term   Breast cancer Maternal Aunt 48   Kidney cancer Maternal Uncle        unknown age dx   Colon  cancer Maternal Uncle        unknown age dx.   Stroke Maternal Uncle    Cancer Paternal Uncle        kidney cancer    Cancer Paternal Uncle        colon cancer   Breast cancer Maternal Grandmother 16   Breast cancer Cousin 97       bilateral w/ metastasis   Breast cancer Cousin        unknown age of dx   Breast cancer Cousin 85       mastectomy   Breast cancer Cousin 46       mastectomy   BRCA 1/2 Cousin        testing in 2015 in [place]   Breast cancer Other       Controlled substance contract: n/a     Review of Systems  Constitutional:  Negative for diaphoresis.  Eyes:  Negative for pain.  Respiratory:  Negative for shortness of breath.   Cardiovascular:  Negative for chest pain, palpitations and leg swelling.  Gastrointestinal:  Negative for abdominal pain.  Endocrine: Negative for polydipsia.  Skin:  Negative for rash.  Neurological:  Negative for dizziness, weakness and headaches.  Hematological:  Does not bruise/bleed easily.  All other systems reviewed and are negative.      Objective:   Physical Exam Vitals and nursing note reviewed.  Constitutional:      General: She is not in acute distress.    Appearance: Normal appearance. She is well-developed.  HENT:     Head: Normocephalic.     Right Ear: Tympanic membrane normal.     Left Ear: Tympanic membrane normal.     Nose: Nose normal.     Mouth/Throat:     Mouth: Mucous membranes are moist.  Eyes:     Pupils: Pupils are equal, round, and reactive to light.  Neck:     Vascular: No carotid bruit or JVD.  Cardiovascular:     Rate and Rhythm: Normal rate and regular rhythm.     Heart sounds: Normal heart sounds.  Pulmonary:     Effort: Pulmonary effort is normal. No respiratory distress.     Breath sounds: Normal breath sounds. No wheezing or rales.  Chest:     Chest wall: No tenderness.  Abdominal:     General: Bowel sounds are normal. There is no distension or abdominal  bruit.     Palpations:  Abdomen is soft. There is no hepatomegaly, splenomegaly, mass or pulsatile mass.     Tenderness: There is no abdominal tenderness.  Musculoskeletal:        General: Normal range of motion.     Cervical back: Normal range of motion and neck supple.  Lymphadenopathy:     Cervical: No cervical adenopathy.  Skin:    General: Skin is warm and dry.  Neurological:     Mental Status: She is alert and oriented to person, place, and time.     Deep Tendon Reflexes: Reflexes are normal and symmetric.  Psychiatric:        Behavior: Behavior normal.        Thought Content: Thought content normal.        Judgment: Judgment normal.    BP 136/79   Pulse 61   Temp (!) 97 F (36.1 C) (Temporal)   Ht 5' 5 (1.651 m)   Wt 219 lb (99.3 kg)   SpO2 96%   BMI 36.44 kg/m       Assessment & Plan:  [facility] comes in today with chief complaint of medical management of chronic issues    Diagnosis and orders addressed:  1. Primary hypertension Low sodium diet - amLODipine  (NORVASC ) 5 MG tablet; Take 1 tablet (5 mg total) by mouth daily.  Dispense: 90 tablet; Refill: 1 - lisinopril  (ZESTRIL ) 20 MG tablet; Take 1 tablet (20 mg total) by mouth daily.  Dispense: 90 tablet; Refill: 1  2. Mixed hyperlipidemia Low fat diet - atorvastatin  (LIPITOR) 40 MG tablet; Take 1 tablet (40 mg total) by mouth daily.  Dispense: 90 tablet; Refill: 1  3. Peripheral edema Elevate legs when sitting  - furosemide  (LASIX ) 20 MG tablet; Take 1 tablet (20 mg total) by mouth 2 (two) times daily.  Dispense: 180 tablet; Refill: 1  4. GERD without esophagitis Avoid spicy foods Do not eat 2 hours prior to bedtime   5. ANXIETY DEPRESSION Stress management - DULoxetine  (CYMBALTA ) 60 MG capsule; Take 1 capsule by mouth daily.  Dispense: 90 capsule; Refill: 1  6. Hx of smoking Do not start smoking again  7. BMI 40.0-44.9, adult (HCC) Discussed diet and exercise for person with BMI >25 Will recheck weight in 3-6  months   8. Tachycardia Avoid caffeine - metoprolol  succinate (TOPROL -XL) 100 MG 24 hr tablet; Take 1 tablet by mouth with or immediately following a meal.  Dispense: 90 tablet; Refill: 1   Labs pending Health Maintenance reviewed Diet and exercise encouraged  Follow up plan: 6 months   Mary-Margaret Gladis, FNP   "

## 2024-04-10 NOTE — Patient Instructions (Signed)

## 2024-10-07 ENCOUNTER — Ambulatory Visit: Admitting: Nurse Practitioner
